# Patient Record
Sex: Male | Born: 1960 | Smoking: Current every day smoker
Health system: Southern US, Community
[De-identification: ages and names within clinical notes are randomized; demographics above are authoritative.]

## PROBLEM LIST (undated history)

## (undated) DIAGNOSIS — E785 Hyperlipidemia, unspecified: Secondary | ICD-10-CM

## (undated) HISTORY — PX: WISDOM TOOTH EXTRACTION: SHX21

## (undated) HISTORY — DX: Hyperlipidemia, unspecified: E78.5

---

## 2011-10-03 DIAGNOSIS — Z23 Encounter for immunization: Secondary | ICD-10-CM

## 2012-07-04 LAB — PSA

## 2012-09-27 ENCOUNTER — Ambulatory Visit: Payer: Self-pay | Admitting: Gastroenterology

## 2012-09-27 LAB — HM COLONOSCOPY

## 2012-09-30 LAB — PATHOLOGY REPORT

## 2012-10-18 ENCOUNTER — Ambulatory Visit (INDEPENDENT_AMBULATORY_CARE_PROVIDER_SITE_OTHER): Payer: 59

## 2012-10-18 DIAGNOSIS — Z23 Encounter for immunization: Secondary | ICD-10-CM

## 2015-01-15 ENCOUNTER — Ambulatory Visit: Payer: Self-pay | Admitting: Unknown Physician Specialty

## 2015-01-15 DIAGNOSIS — E781 Pure hyperglyceridemia: Secondary | ICD-10-CM | POA: Insufficient documentation

## 2015-01-15 DIAGNOSIS — J309 Allergic rhinitis, unspecified: Secondary | ICD-10-CM | POA: Insufficient documentation

## 2015-01-29 ENCOUNTER — Encounter: Payer: Self-pay | Admitting: Unknown Physician Specialty

## 2015-09-03 ENCOUNTER — Encounter: Payer: Self-pay | Admitting: Family Medicine

## 2015-09-10 ENCOUNTER — Ambulatory Visit (INDEPENDENT_AMBULATORY_CARE_PROVIDER_SITE_OTHER): Payer: 59 | Admitting: Family Medicine

## 2015-09-10 ENCOUNTER — Encounter: Payer: Self-pay | Admitting: Family Medicine

## 2015-09-10 VITALS — BP 129/83 | HR 78 | Temp 97.8°F | Ht 68.6 in | Wt 147.0 lb

## 2015-09-10 DIAGNOSIS — Z1159 Encounter for screening for other viral diseases: Secondary | ICD-10-CM

## 2015-09-10 DIAGNOSIS — E781 Pure hyperglyceridemia: Secondary | ICD-10-CM | POA: Diagnosis not present

## 2015-09-10 DIAGNOSIS — N529 Male erectile dysfunction, unspecified: Secondary | ICD-10-CM

## 2015-09-10 DIAGNOSIS — Z0000 Encounter for general adult medical examination without abnormal findings: Secondary | ICD-10-CM | POA: Diagnosis not present

## 2015-09-10 DIAGNOSIS — Z23 Encounter for immunization: Secondary | ICD-10-CM | POA: Diagnosis not present

## 2015-09-10 DIAGNOSIS — D485 Neoplasm of uncertain behavior of skin: Secondary | ICD-10-CM

## 2015-09-10 DIAGNOSIS — Z125 Encounter for screening for malignant neoplasm of prostate: Secondary | ICD-10-CM | POA: Diagnosis not present

## 2015-09-10 DIAGNOSIS — Z114 Encounter for screening for human immunodeficiency virus [HIV]: Secondary | ICD-10-CM

## 2015-09-10 LAB — UA/M W/RFLX CULTURE, ROUTINE
BILIRUBIN UA: NEGATIVE
GLUCOSE, UA: NEGATIVE
KETONES UA: NEGATIVE
Leukocytes, UA: NEGATIVE
Nitrite, UA: NEGATIVE
Protein, UA: NEGATIVE
RBC UA: NEGATIVE
SPEC GRAV UA: 1.02 (ref 1.005–1.030)
UUROB: 0.2 mg/dL (ref 0.2–1.0)
pH, UA: 5.5 (ref 5.0–7.5)

## 2015-09-10 LAB — LIPID PANEL PICCOLO, WAIVED
Chol/HDL Ratio Piccolo,Waive: 4 mg/dL
Cholesterol Piccolo, Waived: 228 mg/dL — ABNORMAL HIGH (ref <200)
HDL CHOL PICCOLO, WAIVED: 57 mg/dL — AB (ref >59)
LDL CHOL CALC PICCOLO WAIVED: 144 mg/dL — AB (ref <100)
Triglycerides Piccolo,Waived: 135 mg/dL (ref <150)
VLDL Chol Calc Piccolo,Waive: 27 mg/dL (ref <30)

## 2015-09-10 MED ORDER — SILDENAFIL CITRATE 100 MG PO TABS: 50.0000 mg | ORAL_TABLET | Freq: Every day | ORAL | 12 refills | Status: DC | PRN

## 2015-09-10 NOTE — Progress Notes (Signed)
BP 129/83 (BP Location: Left Arm, Patient Position: Sitting, Cuff Size: Normal)   Pulse 78   Temp 97.8 F (36.6 C)   Ht 5' 8.6" (1.742 m)   Wt 147 lb (66.7 kg)   SpO2 97%   BMI 21.96 kg/m    Subjective:    Patient ID: Steven Rivera, male    DOB: [birth year], 55 y.o.   MRN: [id number]  HPI: Steven Rivera is a 55 y.o. male presenting on 09/10/2015 for comprehensive medical examination. Current medical complaints include:none  He currently lives with: alone Interim Problems from his last visit: no  Depression Screen done today and results listed below:  Depression screen [facility] 2/9 09/10/2015  Decreased Interest 0  Down, Depressed, Hopeless 0  PHQ - 2 Score 0  Altered sleeping 2  Tired, decreased energy 1  Change in appetite 0  Feeling bad or failure about yourself  0  Trouble concentrating 0  Moving slowly or fidgety/restless 0  Suicidal thoughts 0  PHQ-9 Score 3    Past Medical History:  Past Medical History:  Diagnosis Date  . Hyperlipidemia     Surgical History:  Past Surgical History:  Procedure Laterality Date  . WISDOM TOOTH EXTRACTION      Medications:  No current outpatient prescriptions on file prior to visit.   No current facility-administered medications on file prior to visit.     Allergies:  No Known Allergies  Social History:  Social History   Social History  . Marital status: Unknown    Spouse name: N/A  . Number of children: N/A  . Years of education: N/A   Occupational History  . Not on file.   Social History Main Topics  . Smoking status: Current Every Day Smoker    Packs/day: 0.25    Types: Cigarettes  . Smokeless tobacco: Never Used  . Alcohol use 6.0 oz/week    10 Cans of beer per week  . Drug use: No  . Sexual activity: Yes   Other Topics Concern  . Not on file   Social History Narrative  . No narrative on file   History  Smoking Status  . Current Every Day Smoker  . Packs/day: 0.25  . Types: Cigarettes    Smokeless Tobacco  . Never Used   History  Alcohol Use  . 6.0 oz/week  . 10 Cans of beer per week    Family History:  Family History  Problem Relation Age of Onset  . Hypertension Mother   . Hypertension Father   . Heart disease Father   . Hypertension Brother   . Parkinson's disease Maternal Grandfather   . Cancer Paternal Grandmother     Lung  . Heart attack Paternal Grandfather     Past medical history, surgical history, medications, allergies, family history and social history reviewed with patient today and changes made to appropriate areas of the chart.   Review of Systems  Constitutional: Negative.   HENT: Negative.   Eyes: Negative.   Respiratory: Negative.   Cardiovascular: Negative.   Gastrointestinal: Positive for heartburn (due to pizza). Negative for abdominal pain, blood in stool, constipation, diarrhea, melena, nausea and vomiting.  Genitourinary: Negative.        Has some ED- more often than not, has not used viagra, would like to try it.   Musculoskeletal: Negative.   Skin: Negative.        Spot that won't heal on his L arm   Neurological: Negative.  Endo/Heme/Allergies: Positive for environmental allergies (claritin is helping). Negative for polydipsia. Does not bruise/bleed easily.  Psychiatric/Behavioral: Negative.     All other ROS negative except what is listed above and in the HPI.      Objective:    BP 129/83 (BP Location: Left Arm, Patient Position: Sitting, Cuff Size: Normal)   Pulse 78   Temp 97.8 F (36.6 C)   Ht 5' 8.6" (1.742 m)   Wt 147 lb (66.7 kg)   SpO2 97%   BMI 21.96 kg/m   Wt Readings from Last 3 Encounters:  09/10/15 147 lb (66.7 kg)  03/17/13 154 lb (69.9 kg)    Physical Exam  Constitutional: He is oriented to person, place, and time. He appears well-developed and well-nourished. No distress.  HENT:  Head: Normocephalic and atraumatic.  Right Ear: Hearing, tympanic membrane, external ear and ear canal normal.   Left Ear: Hearing, tympanic membrane, external ear and ear canal normal.  Nose: Nose normal.  Mouth/Throat: Uvula is midline, oropharynx is clear and moist and mucous membranes are normal. No oropharyngeal exudate.  Eyes: Conjunctivae, EOM and lids are normal. Pupils are equal, round, and reactive to light. Right eye exhibits no discharge. Left eye exhibits no discharge. No scleral icterus.  Neck: Normal range of motion. Neck supple. No JVD present. No tracheal deviation present. No thyromegaly present.  Cardiovascular: Normal rate, regular rhythm, normal heart sounds and intact distal pulses.  Exam reveals no gallop and no friction rub.   No murmur heard. Pulmonary/Chest: Effort normal and breath sounds normal. No stridor. No respiratory distress. He has no wheezes. He has no rales. He exhibits no tenderness.  Abdominal: Soft. Bowel sounds are normal. He exhibits no distension and no mass. There is no tenderness. There is no rebound and no guarding.  Genitourinary: Rectum normal, prostate normal and penis normal. No penile tenderness.  Musculoskeletal: Normal range of motion. He exhibits no edema, tenderness or deformity.  Lymphadenopathy:    He has no cervical adenopathy.  Neurological: He is alert and oriented to person, place, and time. He has normal reflexes. He displays normal reflexes. No cranial nerve deficit. He exhibits normal muscle tone. Coordination normal.  Skin: Skin is warm, dry and intact. No rash noted. He is not diaphoretic. No erythema. No pallor.     Psychiatric: He has a normal mood and affect. His speech is normal and behavior is normal. Judgment and thought content normal. Cognition and memory are normal.  Nursing note and vitals reviewed.   Results for orders placed or performed in visit on 09/10/15  Lipid Panel Piccolo, [employer]  Result Value Ref Range   Cholesterol Piccolo, Waived 228 (H) <200 mg/dL   HDL Chol Piccolo, Waived 57 (L) >59 mg/dL   Triglycerides  Piccolo,Waived 135 <150 mg/dL   Chol/HDL Ratio Piccolo,Waive 4.0 mg/dL   LDL Chol Calc Piccolo Waived 144 (H) <100 mg/dL   VLDL Chol Calc Piccolo,Waive 27 <30 mg/dL  UA/M w/rflx Culture, Routine  Result Value Ref Range   Specific Gravity, UA 1.020 1.005 - 1.030   pH, UA 5.5 5.0 - 7.5   Color, UA Yellow Yellow   Appearance Ur Clear Clear   Leukocytes, UA Negative Negative   Protein, UA Negative Negative/Trace   Glucose, UA Negative Negative   Ketones, UA Negative Negative   RBC, UA Negative Negative   Bilirubin, UA Negative Negative   Urobilinogen, Ur 0.2 0.2 - 1.0 mg/dL   Nitrite, UA Negative Negative  Assessment & Plan:   Problem List Items Addressed This Visit      Genitourinary   ED (erectile dysfunction)    Will start viagra. Call with any concerns.         Other   Hypertriglyceridemia    Stable. Continue to monitor. Call with any concerns.       Relevant Medications   sildenafil (VIAGRA) 100 MG tablet   Other Relevant Orders   Comprehensive metabolic panel   Lipid Panel Piccolo, Waived (Completed)    Other Visit Diagnoses    Routine general medical examination at a health care facility    -  Primary   Up to date on vaccines. Screening labs checked today. Colonoscopy up to date. Work on diet and exercise. Recheck 1 year.   Relevant Orders   CBC with Differential/Platelet   Comprehensive metabolic panel   Lipid Panel Piccolo, Waived (Completed)   PSA   TSH   UA/M w/rflx Culture, Routine (Completed)   Hepatitis C Antibody   HIV antibody   Screening for prostate cancer       Labs checked today. Await results.   Relevant Orders   PSA   Need for hepatitis C screening test       Labs checked today. Await results.   Relevant Orders   Hepatitis C Antibody   Screening for HIV without presence of risk factors       Labs checked today. Await results.   Relevant Orders   HIV antibody   Immunization due       Flu shot given today.   Relevant Orders   Flu  Vaccine QUAD 36+ mos PF IM (Fluarix & Fluzone Quad PF) (Completed)   Neoplasm of uncertain behavior of skin       Will return for shave biopsy ASAP.       Discussed aspirin prophylaxis for myocardial infarction prevention and decision was it was not indicated  LABORATORY TESTING:  Health maintenance labs ordered today as discussed above.   The natural history of prostate cancer and ongoing controversy regarding screening and potential treatment outcomes of prostate cancer has been discussed with the patient. The meaning of a false positive PSA and a false negative PSA has been discussed. He indicates understanding of the limitations of this screening test and wishes to proceed with screening PSA testing.  IMMUNIZATIONS:   - Tdap: Tetanus vaccination status reviewed: last tetanus booster within 10 years. - Influenza: Administered today - Pneumovax: Refused - Prevnar: Not applicable - Zostavax vaccine: Refused  SCREENING: - Colonoscopy: Up to date  Discussed with patient purpose of the colonoscopy is to detect colon cancer at curable precancerous or early stages   PATIENT COUNSELING:    Sexuality: Discussed sexually transmitted diseases, partner selection, use of condoms, avoidance of unintended pregnancy  and contraceptive alternatives.   Advised to avoid cigarette smoking.  I discussed with the patient that most people either abstain from alcohol or drink within safe limits (<=14/week and <=4 drinks/occasion for males, <=7/weeks and <= 3 drinks/occasion for females) and that the risk for alcohol disorders and other health effects rises proportionally with the number of drinks per week and how often a drinker exceeds daily limits.  Discussed cessation/primary prevention of drug use and availability of treatment for abuse.   Diet: Encouraged to adjust caloric intake to maintain  or achieve ideal body weight, to reduce intake of dietary saturated fat and total fat, to limit sodium  intake by avoiding high sodium foods  and not adding table salt, and to maintain adequate dietary potassium and calcium preferably from fresh fruits, vegetables, and low-fat dairy products.    stressed the importance of regular exercise  Injury prevention: Discussed safety belts, safety helmets, smoke detector, smoking near bedding or upholstery.   Dental health: Discussed importance of regular tooth brushing, flossing, and dental visits.   Follow up plan: NEXT PREVENTATIVE PHYSICAL DUE IN 1 YEAR. Return couple of weeks, for shave biopsy.

## 2015-09-10 NOTE — Assessment & Plan Note (Signed)
Stable. Continue to monitor. Call with any concerns.  ?

## 2015-09-10 NOTE — Assessment & Plan Note (Signed)
Will start viagra. Call with any concerns.  

## 2015-09-10 NOTE — Patient Instructions (Addendum)
Health Maintenance, Male A healthy lifestyle and preventative care can promote health and wellness.  Maintain regular health, dental, and eye exams.  Eat a healthy diet. Foods like vegetables, fruits, whole grains, low-fat dairy products, and lean protein foods contain the nutrients you need and are low in calories. Decrease your intake of foods high in solid fats, added sugars, and salt. Get information about a proper diet from your health care provider, if necessary.  Regular physical exercise is one of the most important things you can do for your health. Most adults should get at least 150 minutes of moderate-intensity exercise (any activity that increases your heart rate and causes you to sweat) each week. In addition, most adults need muscle-strengthening exercises on 2 or more days a week.   Maintain a healthy weight. The body mass index (BMI) is a screening tool to identify possible weight problems. It provides an estimate of body fat based on height and weight. Your health care provider can find your BMI and can help you achieve or maintain a healthy weight. For males 20 years and older:  A BMI below 18.5 is considered underweight.  A BMI of 18.5 to 24.9 is normal.  A BMI of 25 to 29.9 is considered overweight.  A BMI of 30 and above is considered obese.  Maintain normal blood lipids and cholesterol by exercising and minimizing your intake of saturated fat. Eat a balanced diet with plenty of fruits and vegetables. Blood tests for lipids and cholesterol should begin at age 20 and be repeated every 5 years. If your lipid or cholesterol levels are high, you are over age 50, or you are at high risk for heart disease, you may need your cholesterol levels checked more frequently.Ongoing high lipid and cholesterol levels should be treated with medicines if diet and exercise are not working.  If you smoke, find out from your health care provider how to quit. If you do not use tobacco, do not  start.  Lung cancer screening is recommended for adults aged 55-80 years who are at high risk for developing lung cancer because of a history of smoking. A yearly low-dose CT scan of the lungs is recommended for people who have at least a 30-pack-year history of smoking and are current smokers or have quit within the past 15 years. A pack year of smoking is smoking an average of 1 pack of cigarettes a day for 1 year (for example, a 30-pack-year history of smoking could mean smoking 1 pack a day for 30 years or 2 packs a day for 15 years). Yearly screening should continue until the smoker has stopped smoking for at least 15 years. Yearly screening should be stopped for people who develop a health problem that would prevent them from having lung cancer treatment.  If you choose to drink alcohol, do not have more than 2 drinks per day. One drink is considered to be 12 oz (360 mL) of beer, 5 oz (150 mL) of wine, or 1.5 oz (45 mL) of liquor.  Avoid the use of street drugs. Do not share needles with anyone. Ask for help if you need support or instructions about stopping the use of drugs.  High blood pressure causes heart disease and increases the risk of stroke. High blood pressure is more likely to develop in:  People who have blood pressure in the end of the normal range (100-139/85-89 mm Hg).  People who are overweight or obese.  People who are African American.    If you are 18-39 years of age, have your blood pressure checked every 3-5 years. If you are 40 years of age or older, have your blood pressure checked every year. You should have your blood pressure measured twice--once when you are at a hospital or clinic, and once when you are not at a hospital or clinic. Record the average of the two measurements. To check your blood pressure when you are not at a hospital or clinic, you can use:  An automated blood pressure machine at a pharmacy.  A home blood pressure monitor.  If you are 45-79 years  old, ask your health care provider if you should take aspirin to prevent heart disease.  Diabetes screening involves taking a blood sample to check your fasting blood sugar level. This should be done once every 3 years after age 45 if you are at a normal weight and without risk factors for diabetes. Testing should be considered at a younger age or be carried out more frequently if you are overweight and have at least 1 risk factor for diabetes.  Colorectal cancer can be detected and often prevented. Most routine colorectal cancer screening begins at the age of 50 and continues through age 75. However, your health care provider may recommend screening at an earlier age if you have risk factors for colon cancer. On a yearly basis, your health care provider may provide home test kits to check for hidden blood in the stool. A small camera at the end of a tube may be used to directly examine the colon (sigmoidoscopy or colonoscopy) to detect the earliest forms of colorectal cancer. Talk to your health care provider about this at age 50 when routine screening begins. A direct exam of the colon should be repeated every 5-10 years through age 75, unless early forms of precancerous polyps or small growths are found.  People who are at an increased risk for hepatitis B should be screened for this virus. You are considered at high risk for hepatitis B if:  You were born in a country where hepatitis B occurs often. Talk with your health care provider about which countries are considered high risk.  Your parents were born in a high-risk country and you have not received a shot to protect against hepatitis B (hepatitis B vaccine).  You have HIV or AIDS.  You use needles to inject street drugs.  You live with, or have sex with, someone who has hepatitis B.  You are a man who has sex with other men (MSM).  You get hemodialysis treatment.  You take certain medicines for conditions like cancer, organ  transplantation, and autoimmune conditions.  Hepatitis C blood testing is recommended for all people born from 1945 through 1965 and any individual with known risk factors for hepatitis C.  Healthy men should no longer receive prostate-specific antigen (PSA) blood tests as part of routine cancer screening. Talk to your health care provider about prostate cancer screening.  Testicular cancer screening is not recommended for adolescents or adult males who have no symptoms. Screening includes self-exam, a health care provider exam, and other screening tests. Consult with your health care provider about any symptoms you have or any concerns you have about testicular cancer.  Practice safe sex. Use condoms and avoid high-risk sexual practices to reduce the spread of sexually transmitted infections (STIs).  You should be screened for STIs, including gonorrhea and chlamydia if:  You are sexually active and are younger than 24 years.  You   are older than 24 years, and your health care provider tells you that you are at risk for this type of infection.  Your sexual activity has changed since you were last screened, and you are at an increased risk for chlamydia or gonorrhea. Ask your health care provider if you are at risk.  If you are at risk of being infected with HIV, it is recommended that you take a prescription medicine daily to prevent HIV infection. This is called pre-exposure prophylaxis (PrEP). You are considered at risk if:  You are a man who has sex with other men (MSM).  You are a heterosexual man who is sexually active with multiple partners.  You take drugs by injection.  You are sexually active with a partner who has HIV.  Talk with your health care provider about whether you are at high risk of being infected with HIV. If you choose to begin PrEP, you should first be tested for HIV. You should then be tested every 3 months for as long as you are taking PrEP.  Use sunscreen. Apply  sunscreen liberally and repeatedly throughout the day. You should seek shade when your shadow is shorter than you. Protect yourself by wearing long sleeves, pants, a wide-brimmed hat, and sunglasses year round whenever you are outdoors.  Tell your health care provider of new moles or changes in moles, especially if there is a change in shape or color. Also, tell your health care provider if a mole is larger than the size of a pencil eraser.  A one-time screening for abdominal aortic aneurysm (AAA) and surgical repair of large AAAs by ultrasound is recommended for men aged 68-75 years who are current or former smokers.  Stay current with your vaccines (immunizations).   This information is not intended to replace advice given to you by your health care provider. Make sure you discuss any questions you have with your health care provider.   Document Released: 06/17/2007 Document Revised: 01/09/2014 Document Reviewed: 05/16/2010 Elsevier Interactive Patient Education 2016 [employer]. Sildenafil tablets (Viagra) What is this medicine? SILDENAFIL (sil DEN a fil) is used to treat erection problems in men. This medicine may be used for other purposes; ask your health care provider or pharmacist if you have questions. What should I tell my health care provider before I take this medicine? They need to know if you have any of these conditions: -bleeding disorders -eye or vision problems, including a rare inherited eye disease called retinitis pigmentosa -anatomical deformation of the penis, Peyronie's disease, or history of priapism (painful and prolonged erection) -heart disease, angina, a history of heart attack, irregular heart beats, or other heart problems -high or low blood pressure -history of blood diseases, like sickle cell anemia or leukemia -history of stomach bleeding -kidney disease -liver disease -stroke -an unusual or allergic reaction to sildenafil, other medicines, foods, dyes,  or preservatives -pregnant or trying to get pregnant -breast-feeding How should I use this medicine? Take this medicine by mouth with a glass of water. Follow the directions on the prescription label. The dose is usually taken 1 hour before sexual activity. You should not take the dose more than once per day. Do not take your medicine more often than directed. Talk to your pediatrician regarding the use of this medicine in children. This medicine is not used in children for this condition. Overdosage: If you think you have taken too much of this medicine contact a poison control center or emergency room at once. NOTE:  This medicine is only for you. Do not share this medicine with others. What if I miss a dose? This does not apply. Do not take double or extra doses. What may interact with this medicine? Do not take this medicine with any of the following medications: -cisapride -methscopolamine nitrate -nitrates like amyl nitrite, isosorbide dinitrate, isosorbide mononitrate, nitroglycerin -nitroprusside -other medicines for erectile dysfunction like avanafil, tadalafil, vardenafil -riociguat -other sildenafil products (Revatio) This medicine may also interact with the following medications: -certain drugs for high blood pressure -certain drugs for the treatment of HIV infection or AIDS -certain drugs used for fungal or yeast infections, like fluconazole, itraconazole, ketoconazole, and voriconazole -cimetidine -erythromycin -rifampin This list may not describe all possible interactions. Give your health care provider a list of all the medicines, herbs, non-prescription drugs, or dietary supplements you use. Also tell them if you smoke, drink alcohol, or use illegal drugs. Some items may interact with your medicine. What should I watch for while using this medicine? If you notice any changes in your vision while taking this drug, call your doctor or health care professional as soon as  possible. Stop using this medicine and call your health care provider right away if you have a loss of sight in one or both eyes. Contact your doctor or health care professional right away if you have an erection that lasts longer than 4 hours or if it becomes painful. This may be a sign of a serious problem and must be treated right away to prevent permanent damage. If you experience symptoms of nausea, dizziness, chest pain or arm pain upon initiation of sexual activity after taking this medicine, you should refrain from further activity and call your doctor or health care professional as soon as possible. Do not drink alcohol to excess (examples, 5 glasses of wine or 5 shots of whiskey) when taking this medicine. When taken in excess, alcohol can increase your chances of getting a headache or getting dizzy, increasing your heart rate or lowering your blood pressure. Using this medicine does not protect you or your partner against HIV infection (the virus that causes AIDS) or other sexually transmitted diseases. What side effects may I notice from receiving this medicine? Side effects that you should report to your doctor or health care professional as soon as possible: -allergic reactions like skin rash, itching or hives, swelling of the face, lips, or tongue -breathing problems -changes in hearing -changes in vision -chest pain -fast, irregular heartbeat -prolonged or painful erection -seizures Side effects that usually do not require medical attention (report to your doctor or health care professional if they continue or are bothersome): -back pain -dizziness -flushing -headache -indigestion -muscle aches -nausea -stuffy or runny nose This list may not describe all possible side effects. Call your doctor for medical advice about side effects. You may report side effects to FDA at 1-800-FDA-1088. Where should I keep my medicine? Keep out of reach of children. Store at room temperature  between 15 and 30 degrees C (59 and 86 degrees F). Throw away any unused medicine after the expiration date. NOTE: This sheet is a summary. It may not cover all possible information. If you have questions about this medicine, talk to your doctor, pharmacist, or health care provider.    2016, Elsevier/Gold Standard. (2013-05-09 13:19:04)  You Can Quit Smoking If you are ready to quit smoking or are thinking about it, congratulations! You have chosen to help yourself be healthier and live longer! There are lots of  different ways to quit smoking. Nicotine gum, nicotine patches, a nicotine inhaler, or nicotine nasal spray can help with physical craving. Hypnosis, support groups, and medicines help break the habit of smoking. TIPS TO GET OFF AND STAY OFF CIGARETTES  Learn to predict your moods. Do not let a bad situation be your excuse to have a cigarette. Some situations in your life might tempt you to have a cigarette.  Ask friends and co-workers not to smoke around you.  Make your home smoke-free.  Never have "just one" cigarette. It leads to wanting another and another. Remind yourself of your decision to quit.  On a card, make a list of your reasons for not smoking. Read it at least the same number of times a day as you have a cigarette. Tell yourself everyday, "I do not want to smoke. I choose not to smoke."  Ask someone at home or work to help you with your plan to quit smoking.  Have something planned after you eat or have a cup of coffee. Take a walk or get other exercise to perk you up. This will help to keep you from overeating.  Try a relaxation exercise to calm you down and decrease your stress. Remember, you may be tense and nervous the first two weeks after you quit. This will pass.  Find new activities to keep your hands busy. Play with a pen, coin, or rubber band. Doodle or draw things on paper.  Brush your teeth right after eating. This will help cut down the craving for the  taste of tobacco after meals. You can try mouthwash too.  Try gum, breath mints, or diet candy to keep something in your mouth. IF YOU SMOKE AND WANT TO QUIT:  Do not stock up on cigarettes. Never buy a carton. Wait until one pack is finished before you buy another.  Never carry cigarettes with you at work or at home.  Keep cigarettes as far away from you as possible. Leave them with someone else.  Never carry matches or a lighter with you.  Ask yourself, "Do I need this cigarette or is this just a reflex?"  Bet with someone that you can quit. Put cigarette money in a piggy bank every morning. If you smoke, you give up the money. If you do not smoke, by the end of the week, you keep the money.  Keep trying. It takes 21 days to change a habit!  Talk to your doctor about using medicines to help you quit. These include nicotine replacement gum, lozenges, or skin patches.   This information is not intended to replace advice given to you by your health care provider. Make sure you discuss any questions you have with your health care provider.   Document Released: 10/15/2008 Document Revised: 03/13/2011 Document Reviewed: 10/15/2008 Elsevier Interactive Patient Education 2016 Elsevier Inc. Influenza (Flu) Vaccine (Inactivated or Recombinant):  1. Why get vaccinated? Influenza ("flu") is a contagious disease that spreads around the [place] every year, usually between October and May. Flu is caused by influenza viruses, and is spread mainly by coughing, sneezing, and close contact. Anyone can get flu. Flu strikes suddenly and can last several days. Symptoms vary by age, but can include:  fever/chills  sore throat  muscle aches  fatigue  cough  headache  runny or stuffy nose Flu can also lead to pneumonia and blood infections, and cause diarrhea and seizures in children. If you have a medical condition, such as heart or lung  disease, flu can make it worse. Flu is more  dangerous for some people. Infants and young children, people 9 years of age and older, pregnant women, and people with certain health conditions or a weakened immune system are at greatest risk. Each year thousands of people in the [place] die from flu, and many more are hospitalized. Flu vaccine can:  keep you from getting flu,  make flu less severe if you do get it, and  keep you from spreading flu to your family and other people. 2. Inactivated and recombinant flu vaccines A dose of flu vaccine is recommended every flu season. Children 6 months through 20 years of age may need two doses during the same flu season. Everyone else needs only one dose each flu season. Some inactivated flu vaccines contain a very small amount of a mercury-based preservative called thimerosal. Studies have not shown thimerosal in vaccines to be harmful, but flu vaccines that do not contain thimerosal are available. There is no live flu virus in flu shots. They cannot cause the flu. There are many flu viruses, and they are always changing. Each year a new flu vaccine is made to protect against three or four viruses that are likely to cause disease in the upcoming flu season. But even when the vaccine doesn't exactly match these viruses, it may still provide some protection. Flu vaccine cannot prevent:  flu that is caused by a virus not covered by the vaccine, or  illnesses that look like flu but are not. It takes about 2 weeks for protection to develop after vaccination, and protection lasts through the flu season. 3. Some people should not get this vaccine Tell the person who is giving you the vaccine:  If you have any severe, life-threatening allergies. If you ever had a life-threatening allergic reaction after a dose of flu vaccine, or have a severe allergy to any part of this vaccine, you may be advised not to get vaccinated. Most, but not all, types of flu vaccine contain a small amount of egg  protein.  If you ever had Guillain-Barre Syndrome (also called GBS). Some people with a history of GBS should not get this vaccine. This should be discussed with your doctor.  If you are not feeling well. It is usually okay to get flu vaccine when you have a mild illness, but you might be asked to come back when you feel better. 4. Risks of a vaccine reaction With any medicine, including vaccines, there is a chance of reactions. These are usually mild and go away on their own, but serious reactions are also possible. Most people who get a flu shot do not have any problems with it. Minor problems following a flu shot include:  soreness, redness, or swelling where the shot was given  hoarseness  sore, red or itchy eyes  cough  fever  aches  headache  itching  fatigue If these problems occur, they usually begin soon after the shot and last 1 or 2 days. More serious problems following a flu shot can include the following:  There may be a small increased risk of Guillain-Barre Syndrome (GBS) after inactivated flu vaccine. This risk has been estimated at 1 or 2 additional cases per million people vaccinated. This is much lower than the risk of severe complications from flu, which can be prevented by flu vaccine.  Young children who get the flu shot along with pneumococcal vaccine (PCV13) and/or DTaP vaccine at the same time might be slightly  more likely to have a seizure caused by fever. Ask your doctor for more information. Tell your doctor if a child who is getting flu vaccine has ever had a seizure. Problems that could happen after any injected vaccine:  People sometimes faint after a medical procedure, including vaccination. Sitting or lying down for about 15 minutes can help prevent fainting, and injuries caused by a fall. Tell your doctor if you feel dizzy, or have vision changes or ringing in the ears.  Some people get severe pain in the shoulder and have difficulty moving the  arm where a shot was given. This happens very rarely.  Any medication can cause a severe allergic reaction. Such reactions from a vaccine are very rare, estimated at about 1 in a million doses, and would happen within a few minutes to a few hours after the vaccination. As with any medicine, there is a very remote chance of a vaccine causing a serious injury or death. The safety of vaccines is always being monitored. For more information, visit: http://www.aguilar.org/ 5. What if there is a serious reaction? What should I look for?  Look for anything that concerns you, such as signs of a severe allergic reaction, very high fever, or unusual behavior. Signs of a severe allergic reaction can include hives, swelling of the face and throat, difficulty breathing, a fast heartbeat, dizziness, and weakness. These would start a few minutes to a few hours after the vaccination. What should I do?  If you think it is a severe allergic reaction or other emergency that can't wait, call 9-1-1 and get the person to the nearest hospital. Otherwise, call your doctor.  Reactions should be reported to the Vaccine Adverse Event Reporting System (VAERS). Your doctor should file this report, or you can do it yourself through the VAERS web site at www.vaers.SamedayNews.es, or by calling 314-370-3257. VAERS does not give medical advice. 6. The National Vaccine Injury Compensation Program The [employer] Vaccine Injury Compensation Program (VICP) is a federal program that was created to compensate people who may have been injured by certain vaccines. Persons who believe they may have been injured by a vaccine can learn about the program and about filing a claim by calling 828-216-5555 or visiting the [employer] website at GoldCloset.com.ee. There is a time limit to file a claim for compensation. 7. How can I learn more?  Ask your healthcare provider. He or she can give you the vaccine package insert or suggest  other sources of information.  Call your local or state health department.  Contact the Centers for Disease Control and Prevention (CDC):  Call (574)172-0189 (1-800-CDC-INFO) or  Visit CDC's website at https://gibson.com/ Vaccine Information Statement Inactivated Influenza Vaccine (08/08/2013)   This information is not intended to replace advice given to you by your health care provider. Make sure you discuss any questions you have with your health care provider.   Document Released: 10/13/2005 Document Revised: 01/09/2014 Document Reviewed: 08/11/2013 Elsevier Interactive Patient Education [employer].

## 2015-09-11 LAB — HEPATITIS C ANTIBODY: Hep C Virus Ab: <0.1 s/co ratio (ref 0.0–0.9)

## 2015-09-11 LAB — TSH: TSH: 2.64 u[IU]/mL (ref 0.450–4.500)

## 2015-09-11 LAB — CBC WITH DIFFERENTIAL/PLATELET
BASOS ABS: 0 10*3/uL (ref 0.0–0.2)
Basos: 0 %
EOS (ABSOLUTE): 0.2 10*3/uL (ref 0.0–0.4)
Eos: 4 %
HEMOGLOBIN: 14.8 g/dL (ref 12.6–17.7)
Hematocrit: 42.4 % (ref 37.5–51.0)
IMMATURE GRANS (ABS): 0 10*3/uL (ref 0.0–0.1)
Immature Granulocytes: 0 %
LYMPHS: 34 %
Lymphocytes Absolute: 1.5 10*3/uL (ref 0.7–3.1)
MCH: 32.7 pg (ref 26.6–33.0)
MCHC: 34.9 g/dL (ref 31.5–35.7)
MCV: 94 fL (ref 79–97)
MONOCYTES: 9 %
Monocytes Absolute: 0.4 10*3/uL (ref 0.1–0.9)
NEUTROS ABS: 2.3 10*3/uL (ref 1.4–7.0)
NEUTROS PCT: 53 %
Platelets: 200 10*3/uL (ref 150–379)
RBC: 4.52 x10E6/uL (ref 4.14–5.80)
RDW: 13.7 % (ref 12.3–15.4)
WBC: 4.5 10*3/uL (ref 3.4–10.8)

## 2015-09-11 LAB — PSA: Prostate Specific Ag, Serum: 2.3 ng/mL (ref 0.0–4.0)

## 2015-09-11 LAB — COMPREHENSIVE METABOLIC PANEL
ALBUMIN: 4.1 g/dL (ref 3.5–5.5)
ALT: 28 IU/L (ref 0–44)
AST: 22 IU/L (ref 0–40)
Albumin/Globulin Ratio: 1.6 (ref 1.2–2.2)
Alkaline Phosphatase: 68 IU/L (ref 39–117)
BILIRUBIN TOTAL: 0.6 mg/dL (ref 0.0–1.2)
BUN / CREAT RATIO: 15 (ref 9–20)
BUN: 14 mg/dL (ref 6–24)
CHLORIDE: 100 mmol/L (ref 96–106)
CO2: 24 mmol/L (ref 18–29)
Calcium: 9 mg/dL (ref 8.7–10.2)
Creatinine, Ser: 0.91 mg/dL (ref 0.76–1.27)
GFR calc non Af Amer: 95 mL/min/{1.73_m2} (ref >59)
GFR, EST AFRICAN AMERICAN: 109 mL/min/{1.73_m2} (ref >59)
GLOBULIN, TOTAL: 2.6 g/dL (ref 1.5–4.5)
GLUCOSE: 93 mg/dL (ref 65–99)
Potassium: 4 mmol/L (ref 3.5–5.2)
SODIUM: 139 mmol/L (ref 134–144)
TOTAL PROTEIN: 6.7 g/dL (ref 6.0–8.5)

## 2015-09-11 LAB — HIV ANTIBODY (ROUTINE TESTING W REFLEX): HIV Screen 4th Generation wRfx: NONREACTIVE

## 2015-09-13 ENCOUNTER — Encounter: Payer: Self-pay | Admitting: Family Medicine

## 2015-09-20 ENCOUNTER — Ambulatory Visit (INDEPENDENT_AMBULATORY_CARE_PROVIDER_SITE_OTHER): Payer: 59 | Admitting: Family Medicine

## 2015-09-20 ENCOUNTER — Encounter: Payer: Self-pay | Admitting: Family Medicine

## 2015-09-20 VITALS — BP 128/82 | HR 81 | Temp 97.3°F | Wt 149.0 lb

## 2015-09-20 DIAGNOSIS — D485 Neoplasm of uncertain behavior of skin: Secondary | ICD-10-CM | POA: Diagnosis not present

## 2015-09-20 NOTE — Progress Notes (Signed)
BP 128/82 (BP Location: Left Arm, Patient Position: Sitting, Cuff Size: Normal)   Pulse 81   Temp 97.3 F (36.3 C)   Wt 149 lb (67.6 kg)   SpO2 98%   BMI 22.26 kg/m    Subjective:    Patient ID: Steven Rivera, male    DOB: Oct 29, 1960, 55 y.o.   MRN: [id number]  HPI: Steven Rivera is a 55 y.o. male  Chief Complaint  Patient presents with  . shave biopsy   SKIN LESION Duration: months Location: L arm near elbow Painful: yes Itching: yes Onset: gradual Context: won't heal Associated signs and symptoms: won't heal History of skin cancer: no History of precancerous skin lesions: no Family history of skin cancer: no  Relevant past medical, surgical, family and social history reviewed and updated as indicated. Interim medical history since our last visit reviewed. Allergies and medications reviewed and updated.  Review of Systems  Constitutional: Negative.   Respiratory: Negative.   Cardiovascular: Negative.   Skin: Positive for wound. Negative for color change, pallor and rash.  Psychiatric/Behavioral: Negative.     Per HPI unless specifically indicated above     Objective:    BP 128/82 (BP Location: Left Arm, Patient Position: Sitting, Cuff Size: Normal)   Pulse 81   Temp 97.3 F (36.3 C)   Wt 149 lb (67.6 kg)   SpO2 98%   BMI 22.26 kg/m   Wt Readings from Last 3 Encounters:  09/20/15 149 lb (67.6 kg)  09/10/15 147 lb (66.7 kg)  03/17/13 154 lb (69.9 kg)    Physical Exam  Constitutional: He is oriented to person, place, and time. He appears well-developed and well-nourished. No distress.  HENT:  Head: Normocephalic and atraumatic.  Right Ear: Hearing normal.  Left Ear: Hearing normal.  Nose: Nose normal.  Eyes: Conjunctivae and lids are normal. Right eye exhibits no discharge. Left eye exhibits no discharge. No scleral icterus.  Pulmonary/Chest: Effort normal. No respiratory distress.  Musculoskeletal: Normal range of motion.    Neurological: He is alert and oriented to person, place, and time.  Skin: Skin is warm, dry and intact. No rash noted. No erythema. No pallor.  Non-healing, erythematous, scaling wound on L forearm near his elbow.  Psychiatric: He has a normal mood and affect. His speech is normal and behavior is normal. Judgment and thought content normal. Cognition and memory are normal.    Results for orders placed or performed in visit on 09/10/15  CBC with Differential/Platelet  Result Value Ref Range   WBC 4.5 3.4 - 10.8 x10E3/uL   RBC 4.52 4.14 - 5.80 x10E6/uL   Hemoglobin 14.8 12.6 - 17.7 g/dL   Hematocrit 42.4 37.5 - 51.0 %   MCV 94 79 - 97 fL   MCH 32.7 26.6 - 33.0 pg   MCHC 34.9 31.5 - 35.7 g/dL   RDW 13.7 12.3 - 15.4 %   Platelets 200 150 - 379 x10E3/uL   Neutrophils 53 %   Lymphs 34 %   Monocytes 9 %   Eos 4 %   Basos 0 %   Neutrophils Absolute 2.3 1.4 - 7.0 x10E3/uL   Lymphocytes Absolute 1.5 0.7 - 3.1 x10E3/uL   Monocytes Absolute 0.4 0.1 - 0.9 x10E3/uL   EOS (ABSOLUTE) 0.2 0.0 - 0.4 x10E3/uL   Basophils Absolute 0.0 0.0 - 0.2 x10E3/uL   Immature Granulocytes 0 %   Immature Grans (Abs) 0.0 0.0 - 0.1 x10E3/uL  Comprehensive metabolic panel  Result Value  Ref Range   Glucose 93 65 - 99 mg/dL   BUN 14 6 - 24 mg/dL   Creatinine, Ser 0.91 0.76 - 1.27 mg/dL   GFR calc non Af Amer 95 >59 mL/min/1.73   GFR calc Af Amer 109 >59 mL/min/1.73   BUN/Creatinine Ratio 15 9 - 20   Sodium 139 134 - 144 mmol/L   Potassium 4.0 3.5 - 5.2 mmol/L   Chloride 100 96 - 106 mmol/L   CO2 24 18 - 29 mmol/L   Calcium 9.0 8.7 - 10.2 mg/dL   Total Protein 6.7 6.0 - 8.5 g/dL   Albumin 4.1 3.5 - 5.5 g/dL   Globulin, Total 2.6 1.5 - 4.5 g/dL   Albumin/Globulin Ratio 1.6 1.2 - 2.2   Bilirubin Total 0.6 0.0 - 1.2 mg/dL   Alkaline Phosphatase 68 39 - 117 IU/L   AST 22 0 - 40 IU/L   ALT 28 0 - 44 IU/L  Lipid Panel Piccolo, Waived  Result Value Ref Range   Cholesterol Piccolo, Waived 228 (H) <200 mg/dL    HDL Chol Piccolo, Waived 57 (L) >59 mg/dL   Triglycerides Piccolo,Waived 135 <150 mg/dL   Chol/HDL Ratio Piccolo,Waive 4.0 mg/dL   LDL Chol Calc Piccolo Waived 144 (H) <100 mg/dL   VLDL Chol Calc Piccolo,Waive 27 <30 mg/dL  PSA  Result Value Ref Range   Prostate Specific Ag, Serum 2.3 0.0 - 4.0 ng/mL  TSH  Result Value Ref Range   TSH 2.640 0.450 - 4.500 uIU/mL  UA/M w/rflx Culture, Routine  Result Value Ref Range   Specific Gravity, UA 1.020 1.005 - 1.030   pH, UA 5.5 5.0 - 7.5   Color, UA Yellow Yellow   Appearance Ur Clear Clear   Leukocytes, UA Negative Negative   Protein, UA Negative Negative/Trace   Glucose, UA Negative Negative   Ketones, UA Negative Negative   RBC, UA Negative Negative   Bilirubin, UA Negative Negative   Urobilinogen, Ur 0.2 0.2 - 1.0 mg/dL   Nitrite, UA Negative Negative  Hepatitis C Antibody  Result Value Ref Range   Hep C Virus Ab <0.1 0.0 - 0.9 s/co ratio  HIV antibody  Result Value Ref Range   HIV Screen 4th Generation wRfx Non Reactive Non Reactive      Assessment & Plan:   Problem List Items Addressed This Visit    None    Visit Diagnoses    Neoplasm of uncertain behavior of skin    -  Primary   Concern for squamous cell as it has not been healing. Shave biopsy done today. Will await results.    Relevant Orders   Pathology Report     Skin Procedure  Procedure: Informed consent given.  Sterile prep of the area.  Area infiltrated with lidocaine with epinephrine.  Using a surgical blade, part of the upper dermis shaved off and sent  for pathology.  Area cauterized. Pt ed on scarring.     Diagnosis:   ICD-9-CM ICD-10-CM   1. Neoplasm of uncertain behavior of skin 238.2 D48.5 Pathology Report   Concern for squamous cell as it has not been healing. Shave biopsy done today. Will await results.     Lesion Location/Size: 1.5cm L forearm near elbow Physician: MJ Consent:  Risks, benefits, and alternative treatments discussed and all  questions were answered.  Patient elected to proceed and verbal consent obtained.  Description: Area prepped and draped using semi-sterile technique. Area locally anesthetized using 3.0 cc's of lidocaine 1%  with epi.  Shave biopsy of lesion performed using a #15 blade scalpel.. Area dressed with band aid and specimen sent for pathology.  Post Procedure Instructions: Wound care instructions discussed and patient was instructed to keep area clean and dry.  Signs and symptoms of infection discussed, patient agrees to contact the office ASAP should they occur.  Dressing change recommended every other day.   Follow up plan: Return in about 1 year (around 09/19/2016) for Physical.   Patient became dizzy at the check out desk. Better with sitting, a glass of water and a piece of candy. Out of work note provided.

## 2015-09-22 LAB — PATHOLOGY

## 2015-09-23 ENCOUNTER — Telehealth: Payer: Self-pay | Admitting: Family Medicine

## 2015-09-23 NOTE — Telephone Encounter (Signed)
Please let him know that his spot on his arm was just irritated skin from picking. Nothing to worry about

## 2015-09-23 NOTE — Telephone Encounter (Signed)
Patient notified

## 2016-12-22 ENCOUNTER — Encounter: Payer: Self-pay | Admitting: Family Medicine

## 2016-12-22 ENCOUNTER — Ambulatory Visit (INDEPENDENT_AMBULATORY_CARE_PROVIDER_SITE_OTHER): Payer: 59 | Admitting: Family Medicine

## 2016-12-22 VITALS — BP 123/82 | HR 85 | Temp 98.1°F | Wt 152.0 lb

## 2016-12-22 DIAGNOSIS — B9789 Other viral agents as the cause of diseases classified elsewhere: Secondary | ICD-10-CM

## 2016-12-22 DIAGNOSIS — J069 Acute upper respiratory infection, unspecified: Secondary | ICD-10-CM | POA: Diagnosis not present

## 2016-12-22 MED ORDER — AMOXICILLIN-POT CLAVULANATE 875-125 MG PO TABS: 1.0000 | ORAL_TABLET | Freq: Two times a day (BID) | ORAL | 0 refills | Status: DC

## 2016-12-22 MED ORDER — FLUTICASONE PROPIONATE 50 MCG/ACT NA SUSP: 2.0000 | Freq: Every day | NASAL | 6 refills | Status: AC

## 2016-12-22 NOTE — Progress Notes (Signed)
   BP 123/82   Pulse 85   Temp 98.1 F (36.7 C) (Oral)   Wt 152 lb (68.9 kg)   SpO2 96%   BMI 22.71 kg/m    Subjective:    Patient ID: Steven Rivera, male    DOB: 1960-06-18, 56 y.o.   MRN: [id number]  HPI: Steven Rivera is a 56 y.o. male  Chief Complaint  Patient presents with  . Sinus Problem   About 5-6 days of congestion, rhinorrhea, fatigue, cough. Denies fever, chills, body aches, CP. Has been taking nyquil with some relief. Son has been sick. Hx of seasonal allergies, not currently on allergy medications.   Relevant past medical, surgical, family and social history reviewed and updated as indicated. Interim medical history since our last visit reviewed. Allergies and medications reviewed and updated.  Review of Systems  Constitutional: Positive for fatigue.  HENT: Positive for congestion, rhinorrhea and sinus pressure.   Respiratory: Positive for cough.   Cardiovascular: Negative.   Musculoskeletal: Negative.   Neurological: Negative.   Psychiatric/Behavioral: Negative.    Per HPI unless specifically indicated above     Objective:    BP 123/82   Pulse 85   Temp 98.1 F (36.7 C) (Oral)   Wt 152 lb (68.9 kg)   SpO2 96%   BMI 22.71 kg/m   Wt Readings from Last 3 Encounters:  12/22/16 152 lb (68.9 kg)  09/20/15 149 lb (67.6 kg)  09/10/15 147 lb (66.7 kg)    Physical Exam  Constitutional: He is oriented to person, place, and time. He appears well-developed and well-nourished.  HENT:  Head: Atraumatic.  Right Ear: External ear normal.  Left Ear: External ear normal.  Oropharynx injected Nasal mucosa boggy with rhinorrhea present  Eyes: Conjunctivae are normal. No scleral icterus.  Neck: Normal range of motion. Neck supple.  Cardiovascular: Normal rate and normal heart sounds.  Pulmonary/Chest: Effort normal and breath sounds normal. No respiratory distress. He has no wheezes.  Musculoskeletal: Normal range of motion.  Lymphadenopathy:    He  has no cervical adenopathy.  Neurological: He is alert and oriented to person, place, and time.  Skin: Skin is warm and dry.  Psychiatric: He has a normal mood and affect. His behavior is normal.  Nursing note and vitals reviewed.     Assessment & Plan:   Problem List Items Addressed This Visit    None    Visit Diagnoses    Viral URI with cough    -  Primary   Start flonase and claritin daily in case allergy component. OTC remedies discussed, augmentin sent in case worsening over weekend. F/u if no better       Follow up plan: Return if symptoms worsen or fail to improve.

## 2016-12-24 NOTE — Patient Instructions (Signed)
Follow up as needed

## 2017-05-17 ENCOUNTER — Ambulatory Visit (INDEPENDENT_AMBULATORY_CARE_PROVIDER_SITE_OTHER): Payer: 59 | Admitting: Family Medicine

## 2017-05-17 ENCOUNTER — Inpatient Hospital Stay: Admission: RE | Admit: 2017-05-17 | Payer: Self-pay | Source: Ambulatory Visit

## 2017-05-17 ENCOUNTER — Ambulatory Visit
Admission: RE | Admit: 2017-05-17 | Discharge: 2017-05-17 | Disposition: A | Discharge status: Home / Self Care | Payer: 59 | Source: Ambulatory Visit | Attending: Family Medicine | Admitting: Family Medicine

## 2017-05-17 ENCOUNTER — Encounter: Payer: Self-pay | Admitting: Family Medicine

## 2017-05-17 ENCOUNTER — Telehealth: Payer: Self-pay | Admitting: Family Medicine

## 2017-05-17 VITALS — BP 129/85 | HR 86 | Temp 98.3°F | Wt 148.1 lb

## 2017-05-17 DIAGNOSIS — R251 Tremor, unspecified: Secondary | ICD-10-CM | POA: Diagnosis not present

## 2017-05-17 DIAGNOSIS — R29818 Other symptoms and signs involving the nervous system: Secondary | ICD-10-CM | POA: Diagnosis present

## 2017-05-17 LAB — UA/M W/RFLX CULTURE, ROUTINE
BILIRUBIN UA: NEGATIVE
Glucose, UA: NEGATIVE
KETONES UA: NEGATIVE
Leukocytes, UA: NEGATIVE
Nitrite, UA: NEGATIVE
Protein, UA: NEGATIVE
RBC UA: NEGATIVE
Specific Gravity, UA: 1.02 (ref 1.005–1.030)
UUROB: 0.2 mg/dL (ref 0.2–1.0)
pH, UA: 6.5 (ref 5.0–7.5)

## 2017-05-17 LAB — BAYER DCA HB A1C WAIVED: HB A1C (BAYER DCA - WAIVED): 5.6 % (ref <7.0)

## 2017-05-17 NOTE — Telephone Encounter (Signed)
Copied from [place] [id number]. Topic: Quick Communication - See Telephone Encounter >> May 17, 2017 12:59 PM Vernona Rieger wrote: CRM for notification. See Telephone encounter for: 05/17/17.  Kindra @ [facility] dept needs a prior autho for the patient to have a STAT CT on 5/17. Call back @ [phone]

## 2017-05-17 NOTE — Telephone Encounter (Signed)
CT has been authorized. Number in referral, as well as left on CT's vm

## 2017-05-17 NOTE — Telephone Encounter (Signed)
Patient notified

## 2017-05-17 NOTE — Telephone Encounter (Signed)
Please let him know that his CT came back normal- we'll wait on the blood work and see if anything comes up. Thanks!

## 2017-05-17 NOTE — Patient Instructions (Signed)
Tremor A tremor is trembling or shaking that you cannot control. Most tremors affect the hands or arms. Tremors can also affect the head, vocal cords, face, and other parts of the body. There are many types of tremors. Common types include:  Essential tremor. These usually occur in people over the age of 40. It may run in families and can happen in otherwise healthy people.  Resting tremor. These occur when the muscles are at rest, such as when your hands are resting in your lap. People with Parkinson disease often have resting tremors.  Postural tremor. These occur when you try to hold a pose, such as keeping your hands outstretched.  Kinetic tremor. These occur during purposeful movement, such as trying to touch a finger to your nose.  Task-specific tremor. These may occur when you perform tasks such as handwriting, speaking, or standing.  Psychogenic tremor. These dramatically lessen or disappear when you are distracted. They can happen in people of all ages.  Some types of tremors have no known cause. Tremors can also be a symptom of nervous system problems (neurological disorders) that may occur with aging. Some tremors go away with treatment while others do not. Follow these instructions at home: Watch your tremor for any changes. The following actions may help to lessen any discomfort you are feeling:  Take medicines only as directed by your health care provider.  Limit alcohol intake to no more than 1 drink per day for nonpregnant women and 2 drinks per day for men. One drink equals 12 oz of beer, 5 oz of wine, or 1 oz of hard liquor.  Do not use any tobacco products, including cigarettes, chewing tobacco, or electronic cigarettes. If you need help quitting, ask your health care provider.  Avoid extreme heat or cold.  Limit the amount of caffeine you consumeas directed by your health care provider.  Try to get 8 hours of sleep each night.  Find ways to manage your stress,  such as meditation or yoga.  Keep all follow-up visits as directed by your health care provider. This is important.  Contact a health care provider if:  You start having a tremor after starting a new medicine.  You have tremor with other symptoms such as: ? Numbness. ? Tingling. ? Pain. ? Weakness.  Your tremor gets worse.  Your tremor interferes with your day-to-day life. This information is not intended to replace advice given to you by your health care provider. Make sure you discuss any questions you have with your health care provider. Document Released: 12/09/2001 Document Revised: 08/22/2015 Document Reviewed: 06/16/2013 Elsevier Interactive Patient Education  2018 [employer]  

## 2017-05-17 NOTE — Progress Notes (Signed)
BP 129/85 (BP Location: Right Arm, Patient Position: Sitting, Cuff Size: Normal)   Pulse 86   Temp 98.3 F (36.8 C)   Wt 148 lb 2 oz (67.2 kg)   SpO2 96%   BMI 22.13 kg/m    Subjective:    Patient ID: Steven Rivera, male    DOB: 25-Feb-1960, 57 y.o.   MRN: [id number]  HPI: Steven Rivera is a 57 y.o. male  Chief Complaint  Patient presents with  . Eye Twitching    jaw twitching, patient states that this past Tuesday his eye and jaw started twitching really bad, he finished prednisone 8 days before that. When he woke up on Wednesday he was fine and has not had an issue since.   Was on prednisone for a cold about 2 weeks ago. Was taking 50mg  of the prednisone up until 11 days ago. 2 days ago, started having twitching and felt like his hands were shaking, but they weren't. This started 2 days ago about 4PM and lasted about an hour, then went away for about a 1/2 hour and then came back for several hours. Wasn't feeling nervous. Eye and jaw were twitching. Some changes in his vision- made it worse. He had no pains in his chest. Was having some weakness throughout, feeling tired. He went to bed, woke up and all of this is 100% gone- so he called to come in.   Relevant past medical, surgical, family and social history reviewed and updated as indicated. Interim medical history since our last visit reviewed. Allergies and medications reviewed and updated.  Review of Systems  Constitutional: Negative.   Respiratory: Negative.   Cardiovascular: Negative.   Neurological: Positive for tremors, weakness and light-headedness. Negative for dizziness, seizures, syncope, facial asymmetry, speech difficulty, numbness and headaches.  Psychiatric/Behavioral: Negative.     Per HPI unless specifically indicated above     Objective:    BP 129/85 (BP Location: Right Arm, Patient Position: Sitting, Cuff Size: Normal)   Pulse 86   Temp 98.3 F (36.8 C)   Wt 148 lb 2 oz (67.2 kg)   SpO2  96%   BMI 22.13 kg/m   Wt Readings from Last 3 Encounters:  05/17/17 148 lb 2 oz (67.2 kg)  12/22/16 152 lb (68.9 kg)  09/20/15 149 lb (67.6 kg)    Physical Exam  Constitutional: He is oriented to person, place, and time. He appears well-developed and well-nourished. No distress.  HENT:  Head: Normocephalic and atraumatic.  Right Ear: Hearing normal.  Left Ear: Hearing normal.  Nose: Nose normal.  Eyes: Conjunctivae and lids are normal. Right eye exhibits no discharge. Left eye exhibits no discharge. No scleral icterus.  Neck: Normal range of motion. Neck supple. No JVD present. No tracheal deviation present. No thyromegaly present.  No bruits  Cardiovascular: Normal rate, regular rhythm, normal heart sounds and intact distal pulses. Exam reveals no gallop and no friction rub.  No murmur heard. Pulmonary/Chest: Effort normal and breath sounds normal. No stridor. No respiratory distress. He has no wheezes. He has no rales. He exhibits no tenderness.  Musculoskeletal: Normal range of motion. He exhibits no edema, tenderness or deformity.  Lymphadenopathy:    He has no cervical adenopathy.  Neurological: He is alert and oriented to person, place, and time. He displays normal reflexes. No cranial nerve deficit or sensory deficit. He exhibits normal muscle tone. Coordination normal.  Skin: Skin is warm, dry and intact. Capillary refill takes less than 2  seconds. No rash noted. He is not diaphoretic. No erythema. No pallor.  Psychiatric: He has a normal mood and affect. His speech is normal and behavior is normal. Judgment and thought content normal. Cognition and memory are normal.  Nursing note and vitals reviewed.   Results for orders placed or performed in visit on 09/20/15  Pathology Report  Result Value Ref Range   . Comment    . Comment    . Comment    . Comment    . Comment    . Comment    . Comment       Assessment & Plan:   Problem List Items Addressed This Visit     None    Visit Diagnoses    Tremor    -  Primary   Started with severe tremor- 9 days after 5 day burst of prednisone. Concern for TIA- will check labs and CT head. Await results.    Relevant Orders   CBC with Differential/Platelet   Bayer DCA Hb A1c Waived   Comprehensive metabolic panel   Lipid Panel w/o Chol/HDL Ratio   Thyroid Panel With TSH   UA/M w/rflx Culture, Routine   Other symptoms and signs involving the nervous system       Started with severe tremor- 9 days after 5 day burst of prednisone. Concern for TIA- will check labs and CT head. Await results.    Relevant Orders   CT Head Wo Contrast       Follow up plan: Return in about 2 weeks (around 05/31/2017) for physical- follow up on shakes.

## 2017-05-18 ENCOUNTER — Ambulatory Visit: Admission: RE | Admit: 2017-05-18 | Payer: Self-pay | Source: Ambulatory Visit

## 2017-05-18 ENCOUNTER — Telehealth: Payer: Self-pay | Admitting: Family Medicine

## 2017-05-18 DIAGNOSIS — R251 Tremor, unspecified: Secondary | ICD-10-CM

## 2017-05-18 LAB — CBC WITH DIFFERENTIAL/PLATELET
BASOS ABS: 0 10*3/uL (ref 0.0–0.2)
Basos: 0 %
EOS (ABSOLUTE): 0.1 10*3/uL (ref 0.0–0.4)
EOS: 1 %
HEMATOCRIT: 46.6 % (ref 37.5–51.0)
HEMOGLOBIN: 15.3 g/dL (ref 13.0–17.7)
IMMATURE GRANS (ABS): 0 10*3/uL (ref 0.0–0.1)
Immature Granulocytes: 0 %
LYMPHS ABS: 1.9 10*3/uL (ref 0.7–3.1)
LYMPHS: 32 %
MCH: 31.9 pg (ref 26.6–33.0)
MCHC: 32.8 g/dL (ref 31.5–35.7)
MCV: 97 fL (ref 79–97)
MONOCYTES: 6 %
Monocytes Absolute: 0.4 10*3/uL (ref 0.1–0.9)
Neutrophils Absolute: 3.5 10*3/uL (ref 1.4–7.0)
Neutrophils: 61 %
Platelets: 230 10*3/uL (ref 150–379)
RBC: 4.79 x10E6/uL (ref 4.14–5.80)
RDW: 14 % (ref 12.3–15.4)
WBC: 5.9 10*3/uL (ref 3.4–10.8)

## 2017-05-18 LAB — COMPREHENSIVE METABOLIC PANEL
ALBUMIN: 4.3 g/dL (ref 3.5–5.5)
ALK PHOS: 72 IU/L (ref 39–117)
ALT: 19 IU/L (ref 0–44)
AST: 19 IU/L (ref 0–40)
Albumin/Globulin Ratio: 1.5 (ref 1.2–2.2)
BUN / CREAT RATIO: 14 (ref 9–20)
BUN: 15 mg/dL (ref 6–24)
Bilirubin Total: 0.6 mg/dL (ref 0.0–1.2)
CHLORIDE: 100 mmol/L (ref 96–106)
CO2: 24 mmol/L (ref 20–29)
Calcium: 9.4 mg/dL (ref 8.7–10.2)
Creatinine, Ser: 1.04 mg/dL (ref 0.76–1.27)
GFR calc non Af Amer: 79 mL/min/{1.73_m2} (ref >59)
GFR, EST AFRICAN AMERICAN: 92 mL/min/{1.73_m2} (ref >59)
GLOBULIN, TOTAL: 2.9 g/dL (ref 1.5–4.5)
GLUCOSE: 85 mg/dL (ref 65–99)
Potassium: 4.9 mmol/L (ref 3.5–5.2)
SODIUM: 138 mmol/L (ref 134–144)
Total Protein: 7.2 g/dL (ref 6.0–8.5)

## 2017-05-18 LAB — LIPID PANEL W/O CHOL/HDL RATIO
Cholesterol, Total: 223 mg/dL — ABNORMAL HIGH (ref 100–199)
HDL: 44 mg/dL (ref >39)
LDL Calculated: 152 mg/dL — ABNORMAL HIGH (ref 0–99)
Triglycerides: 134 mg/dL (ref 0–149)
VLDL CHOLESTEROL CAL: 27 mg/dL (ref 5–40)

## 2017-05-18 LAB — THYROID PANEL WITH TSH
Free Thyroxine Index: 2.2 (ref 1.2–4.9)
T3 UPTAKE RATIO: 29 % (ref 24–39)
T4, Total: 7.5 ug/dL (ref 4.5–12.0)
TSH: 2.05 u[IU]/mL (ref 0.450–4.500)

## 2017-05-18 NOTE — Telephone Encounter (Signed)
Please let him know that all his labs are nice and normal. We'll get him into see the neurologist to further work up the shakes/twitching, but everything else is normal.

## 2017-05-18 NOTE — Telephone Encounter (Signed)
Patient notified

## 2017-05-21 ENCOUNTER — Encounter: Payer: Self-pay | Admitting: Neurology

## 2017-05-23 NOTE — Progress Notes (Signed)
Steven Rivera was seen today in neurologic consultation at the request of Valerie Roys, DO.  The consultation is for the evaluation of tremor.  The records that were made available to me were reviewed.  Pt really doesn't describe tremor, although he calls it that.  He states that a week ago Tuesday, he reported "tremor of the cheeks and eyelids."  He didn't look at all in the mirror but coworkers thought that face was "drawing" on "one side (not sure what side)."  He seemed to be blinking alot.  He tried to eat late in the night and felt that he couldn't swallow.  He tried to drink and "it would run out the side of the mouth."  He had no trouble with paresthesias.  Speech seemed slurred.  No trouble ambulating.  No new meds.  No EtOH.  No drugs.  He went to bed and he never had it again.     Neuroimaging has  previously been performed.  It is available for my review today.  Patient had a CT of the brain on May 17, 2017 and that was normal per report.  I did note some BG calcifications on that exam.  PREVIOUS MEDICATIONS:   ALLERGIES:  No Known Allergies  CURRENT MEDICATIONS:  Outpatient Encounter Medications as of 05/25/2017  Medication Sig  . fluticasone (FLONASE) 50 MCG/ACT nasal spray Place 2 sprays into both nostrils daily.  Marland Kitchen loratadine (CLARITIN) 10 MG tablet Take 10 mg by mouth daily.   No facility-administered encounter medications on file as of 05/25/2017.     PAST MEDICAL HISTORY:   Past Medical History:  Diagnosis Date  . Hyperlipidemia     PAST SURGICAL HISTORY:   Past Surgical History:  Procedure Laterality Date  . WISDOM TOOTH EXTRACTION      SOCIAL HISTORY:   Social History   Socioeconomic History  . Marital status: Unknown    Spouse name: Not on file  . Number of children: Not on file  . Years of education: Not on file  . Highest education level: Not on file  Occupational History    Comment: machinest  Social Needs  . Financial resource strain: Not  on file  . Food insecurity:    Worry: Not on file    Inability: Not on file  . Transportation needs:    Medical: Not on file    Non-medical: Not on file  Tobacco Use  . Smoking status: Current Every Day Smoker    Packs/day: 0.75    Years: 40.00    Pack years: 30.00    Types: Cigarettes  . Smokeless tobacco: Never Used  Substance and Sexual Activity  . Alcohol use: Yes    Alcohol/week: 7.2 oz    Types: 12 Cans of beer per week    Comment: 12 beers/week  . Drug use: No  . Sexual activity: Yes  Lifestyle  . Physical activity:    Days per week: Not on file    Minutes per session: Not on file  . Stress: Not on file  Relationships  . Social connections:    Talks on phone: Not on file    Gets together: Not on file    Attends religious service: Not on file    Active member of club or organization: Not on file    Attends meetings of clubs or organizations: Not on file    Relationship status: Not on file  . Intimate partner violence:    Fear of  current or ex partner: Not on file    Emotionally abused: Not on file    Physically abused: Not on file    Forced sexual activity: Not on file  Other Topics Concern  . Not on file  Social History Narrative  . Not on file    FAMILY HISTORY:   Family Status  Relation Name Status  . Mother  Alive  . Father  Deceased  . Brother X 3 Alive  . Sister  Alive  . Son  Alive  . MGM  Deceased  . MGF  Deceased  . PGM  Deceased  . PGF  Deceased  . Brother X1 Deceased       MVA  . Son  Alive    ROS:  A complete 10 system review of systems was obtained and was unremarkable apart from what is mentioned above.  PHYSICAL EXAMINATION:    VITALS:   Vitals:   05/25/17 0942  BP: 110/80  Pulse: 90  SpO2: 97%  Weight: 149 lb (67.6 kg)  Height: 5\' 9"  (1.753 m)    GEN:  Normal appears male in no acute distress.  Appears stated age. HEENT:  Normocephalic, atraumatic. The mucous membranes are moist. The superficial temporal arteries are  without ropiness or tenderness. Cardiovascular: Regular rate and rhythm. Lungs: Clear to auscultation bilaterally. Neck/Heme: There are no carotid bruits noted bilaterally.  NEUROLOGICAL: Orientation:  The patient is alert and oriented x 3.  Fund of knowledge is appropriate.  Recent and remote memory intact.  Attention span and concentration normal.  Repeats and names without difficulty. Cranial nerves: There is good facial symmetry. The pupils are equal round and reactive to light bilaterally. Fundoscopic exam reveals clear disc margins bilaterally. Extraocular muscles are intact and visual fields are full to confrontational testing. Speech is fluent and clear. Soft palate rises symmetrically and there is no tongue deviation. Hearing is intact to conversational tone. Tone: Tone is good throughout. Sensation: Sensation is intact to light touch and pinprick throughout (facial, trunk, extremities). Vibration is intact at the bilateral big toe. There is no extinction with double simultaneous stimulation. There is no sensory dermatomal level identified. Coordination:  The patient has no difficulty with RAM's or FNF bilaterally. Motor: Strength is 5/5 in the bilateral upper and lower extremities.  Shoulder shrug is equal and symmetric. There is no pronator drift.  There are no fasciculations noted. DTR's: Deep tendon reflexes are 2/4 at the bilateral biceps, triceps, brachioradialis, patella and achilles.  Plantar responses are downgoing bilaterally. Gait and Station: The patient is able to ambulate without difficulty. The patient is able to heel toe walk without any difficulty. The patient is able to ambulate in a tandem fashion. The patient is able to stand in the Romberg position. Abnormal movements:  none  Labs:   Chemistry      Component Value Date/Time   NA 138 05/17/2017 1343   K 4.9 05/17/2017 1343   CL 100 05/17/2017 1343   CO2 24 05/17/2017 1343   BUN 15 05/17/2017 1343   CREATININE  1.04 05/17/2017 1343      Component Value Date/Time   CALCIUM 9.4 05/17/2017 1343   ALKPHOS 72 05/17/2017 1343   AST 19 05/17/2017 1343   ALT 19 05/17/2017 1343   BILITOT 0.6 05/17/2017 1343     Lab Results  Component Value Date   TSH 2.050 05/17/2017   Lab Results  Component Value Date   CHOL 223 (H) 05/17/2017   HDL 44  05/17/2017   LDLCALC 152 (H) 05/17/2017   TRIG 134 05/17/2017      IMPRESSION/PLAN  1. Possible TIA  -difficult to ascertain through history.  TIA certainly would not cause facial spasm bilaterally or blinking, but he also did not look in the mirror when it happened.  He describes acute onset of swallowing difficulties, speech difficulties, drooling when trying to eat and unilateral facial asymmetry as described by his coworkers.  Suspect likely TIA.  We discussed the diagnosis as well as pathophysiology of the disease.  We discussed signs and sx's of stroke and importance of calling 911 immediately should he have any of these.  Patient education was provided.    -Talked about stroke risk factors which include tobacco use, hyperlipidemia.  Talked about those risk factors which are modifiable.    -Talked about importance of lipid control and proper diet.  Lipids should be managed intensively, with a goal LDL < 70 mg/dL.  His LDL was quite high when last checked last week, but he states that he had just eaten at [employer] and was not fasting.  He is fasting today.  I will recheck this.  -Talked about importance of blood pressure control with a goal <130/80 mm Hg.   -I counseled the patient on measures to reduce stroke risk, including the importance of medication compliance, risk factor control, exercise, healthy diet, and avoidance of smoking.    -We will add aspirin, 81 mg daily.  Risks, benefits, side effects and alternative therapies were discussed.  The opportunity to ask questions was given and they were answered to the best of my ability.  The patient  expressed understanding and willingness to follow the outlined treatment protocols.  -will do MRI brain, although told him it may be normal  -will do carotid u/s  2.  Tobacco abuse  -Currently smoking just under 1 packs/day    - Patient was informed of the dangers of tobacco abuse including stroke, cancer, and MI, as well as benefits of tobacco cessation.  - Patient "wants to quit but it's hard."    - Approximately 3 mins were spent counseling patient cessation techniques. We discussed various methods to help quit smoking, including deciding on a date to quit, joining a support group, pharmacological agents- nicotine gum/patch/lozenges, chantix,   - I will reassess his progress at future visit.  3.  Will call him with results of the above.  He will see me in 6 months, sooner should new issues arise.  Neuro exam non focal and non lateralizing today.        Cc:  Park Liter P, DO

## 2017-05-25 ENCOUNTER — Ambulatory Visit (INDEPENDENT_AMBULATORY_CARE_PROVIDER_SITE_OTHER): Payer: 59 | Admitting: Neurology

## 2017-05-25 ENCOUNTER — Encounter: Payer: Self-pay | Admitting: Neurology

## 2017-05-25 ENCOUNTER — Other Ambulatory Visit: Payer: 59

## 2017-05-25 VITALS — BP 110/80 | HR 90 | Ht 69.0 in | Wt 149.0 lb

## 2017-05-25 DIAGNOSIS — E7849 Other hyperlipidemia: Secondary | ICD-10-CM

## 2017-05-25 DIAGNOSIS — G459 Transient cerebral ischemic attack, unspecified: Secondary | ICD-10-CM

## 2017-05-25 DIAGNOSIS — Z720 Tobacco use: Secondary | ICD-10-CM

## 2017-05-25 LAB — LIPID PANEL
CHOLESTEROL: 207 mg/dL — AB (ref <200)
HDL: 43 mg/dL (ref >40)
LDL CHOLESTEROL (CALC): 139 mg/dL — AB
Non-HDL Cholesterol (Calc): 164 mg/dL (calc) — ABNORMAL HIGH (ref <130)
Total CHOL/HDL Ratio: 4.8 (calc) (ref <5.0)
Triglycerides: 127 mg/dL (ref <150)

## 2017-05-25 NOTE — Patient Instructions (Addendum)
1.  Start aspirin - 81 mg daily 2.  Stop smoking.  We have programs to help you if you are interested.   3. Your provider has requested that you have labwork completed today. Please go to [facility] ([id number]) on the second floor of this building before leaving the office today. You do not need to check in. If you are not called within 15 minutes please check with the front desk.  4. We have scheduled you at [facility] for your carotid ultrasound. They are located at [address] [place], [place] [address]. You are scheduled for 06/07/17 at 3:00 pm. If this is not a good date/time please call [phone] to reschedule.  5. We have scheduled you at [facility] outpatient imaging for your MRI on 06/06/17 at 11:00 am. Please arrive 15 minutes prior and go to radiology at [facility] in [place]. If you need to reschedule for any reason please call [phone].

## 2017-05-29 ENCOUNTER — Other Ambulatory Visit: Payer: Self-pay | Admitting: Neurology

## 2017-05-29 ENCOUNTER — Telehealth: Payer: Self-pay | Admitting: Neurology

## 2017-05-29 DIAGNOSIS — G459 Transient cerebral ischemic attack, unspecified: Secondary | ICD-10-CM

## 2017-05-29 MED ORDER — ATORVASTATIN CALCIUM 20 MG PO TABS: 20.0000 mg | ORAL_TABLET | Freq: Every day | ORAL | 1 refills | Status: AC

## 2017-05-29 NOTE — Telephone Encounter (Signed)
Patient made aware and he is agreeable. RX sent to pharmacy.

## 2017-05-29 NOTE — Telephone Encounter (Signed)
-----   Message from [place], DO sent at 05/29/2017  7:24 AM EDT ----- Let pt know that fasting LDL was still quite high.  Would recommend he start lipitor, 20 mg daily and follow low chol diet

## 2017-06-06 ENCOUNTER — Ambulatory Visit
Admission: RE | Admit: 2017-06-06 | Discharge: 2017-06-06 | Disposition: A | Discharge status: Home / Self Care | Payer: 59 | Source: Ambulatory Visit | Attending: Neurology | Admitting: Neurology

## 2017-06-06 ENCOUNTER — Other Ambulatory Visit: Payer: Self-pay | Admitting: Neurology

## 2017-06-06 DIAGNOSIS — Z0100 Encounter for examination of eyes and vision without abnormal findings: Secondary | ICD-10-CM | POA: Diagnosis not present

## 2017-06-06 DIAGNOSIS — G459 Transient cerebral ischemic attack, unspecified: Secondary | ICD-10-CM

## 2017-06-06 DIAGNOSIS — Z139 Encounter for screening, unspecified: Secondary | ICD-10-CM

## 2017-06-07 ENCOUNTER — Ambulatory Visit (INDEPENDENT_AMBULATORY_CARE_PROVIDER_SITE_OTHER): Payer: 59

## 2017-06-07 ENCOUNTER — Telehealth: Payer: Self-pay | Admitting: Neurology

## 2017-06-07 DIAGNOSIS — G459 Transient cerebral ischemic attack, unspecified: Secondary | ICD-10-CM | POA: Diagnosis not present

## 2017-06-07 NOTE — Telephone Encounter (Signed)
Tried to call to let patient know results. No answer. No voicemail set up.

## 2017-06-07 NOTE — Telephone Encounter (Signed)
-----   Message from [place], DO sent at 06/07/2017  7:25 AM EDT ----- Reviewed.  Let pt know that MRI brain is normal

## 2017-06-08 ENCOUNTER — Telehealth: Payer: Self-pay | Admitting: Neurology

## 2017-06-08 NOTE — Telephone Encounter (Signed)
Patient made aware Carotid US and MR okay.

## 2017-06-08 NOTE — Telephone Encounter (Signed)
-----   Message from [place], DO sent at 06/08/2017  7:36 AM EDT ----- Let pt know that carotid u/s looks good

## 2018-03-23 ENCOUNTER — Encounter: Payer: Self-pay | Admitting: Emergency Medicine

## 2018-03-23 ENCOUNTER — Emergency Department
Admission: EM | Admit: 2018-03-23 | Discharge: 2018-03-23 | Disposition: A | Discharge status: Home / Self Care | Payer: 59 | Attending: Emergency Medicine | Admitting: Emergency Medicine

## 2018-03-23 ENCOUNTER — Other Ambulatory Visit: Payer: Self-pay

## 2018-03-23 DIAGNOSIS — Z79899 Other long term (current) drug therapy: Secondary | ICD-10-CM | POA: Insufficient documentation

## 2018-03-23 DIAGNOSIS — F17210 Nicotine dependence, cigarettes, uncomplicated: Secondary | ICD-10-CM | POA: Insufficient documentation

## 2018-03-23 DIAGNOSIS — L0291 Cutaneous abscess, unspecified: Secondary | ICD-10-CM

## 2018-03-23 MED ORDER — SULFAMETHOXAZOLE-TRIMETHOPRIM 800-160 MG PO TABS: 1.0000 | ORAL_TABLET | Freq: Two times a day (BID) | ORAL | 0 refills | Status: DC

## 2018-03-23 NOTE — ED Notes (Signed)
First Nurse Note: Pt c/o rash x 1 month, pt states rash is now "tunneling".

## 2018-03-23 NOTE — ED Provider Notes (Signed)
[facility] Emergency Department Provider Note  ____________________________________________   First MD Initiated Contact with Patient 03/23/18 1128     (approximate)  I have reviewed the triage vital signs and the nursing notes.   HISTORY  Chief Complaint Rash    HPI Steven Rivera is a 58 y.o. male presents emergency department with a rash on the back of his leg and in his groin area.  He states his symptoms have been there for for 1 months but recently have worsened.  He states they developed heads on them and pus started to drain.  He denies any fever or chills.  Denies any chest pain or shortness of breath.    Past Medical History:  Diagnosis Date   Hyperlipidemia     Patient Active Problem List   Diagnosis Date Noted   ED (erectile dysfunction) 09/10/2015   Allergic rhinitis 01/15/2015   Hypertriglyceridemia 01/15/2015    Past Surgical History:  Procedure Laterality Date   WISDOM TOOTH EXTRACTION      Prior to Admission medications   Medication Sig Start Date End Date Taking? Authorizing Provider  atorvastatin (LIPITOR) 20 MG tablet Take 1 tablet (20 mg total) by mouth daily. 05/29/17   Tat, Eustace Quail, DO  fluticasone (FLONASE) 50 MCG/ACT nasal spray Place 2 sprays into both nostrils daily. 12/22/16   Volney American, PA-C  loratadine (CLARITIN) 10 MG tablet Take 10 mg by mouth daily.    [provider]  sulfamethoxazole-trimethoprim (BACTRIM DS,SEPTRA DS) 800-160 MG tablet Take 1 tablet by mouth 2 (two) times daily. 03/23/18   Versie Starks, PA-C    Allergies Patient has no known allergies.  Family History  Problem Relation Age of Onset   Hypertension Mother    Hypertension Father    Heart disease Father    Hypertension Brother    Parkinson's disease Maternal Grandfather    Cancer Paternal Grandmother        Lung   Heart attack Paternal Grandfather     Social History Social History    Tobacco Use   Smoking status: Current Every Day Smoker    Packs/day: 0.75    Years: 40.00    Pack years: 30.00    Types: Cigarettes   Smokeless tobacco: Never Used  Substance Use Topics   Alcohol use: Yes    Alcohol/week: 12.0 standard drinks    Types: 12 Cans of beer per week    Comment: 12 beers/week   Drug use: No    Review of Systems  Constitutional: No fever/chills Eyes: No visual changes. ENT: No sore throat. Respiratory: Denies cough Genitourinary: Negative for dysuria. Musculoskeletal: Negative for back pain. Skin: Positive for rash.    ____________________________________________   PHYSICAL EXAM:  VITAL SIGNS: ED Triage Vitals  Enc Vitals Group     BP 03/23/18 1118 (!) 141/87     Pulse Rate 03/23/18 1118 84     Resp 03/23/18 1118 16     Temp 03/23/18 1118 98.2 F (36.8 C)     Temp Source 03/23/18 1118 Oral     SpO2 03/23/18 1118 96 %     Weight 03/23/18 1117 155 lb (70.3 kg)     Height 03/23/18 1117 5\' 10"  (1.778 m)     Head Circumference --      Peak Flow --      Pain Score 03/23/18 1121 6     Pain Loc --      Pain Edu? --  Excl. in [place]? --     Constitutional: Alert and oriented. Well appearing and in no acute distress. Eyes: Conjunctivae are normal.  Head: Atraumatic. Nose: No congestion/rhinnorhea. Mouth/Throat: Mucous membranes are moist.   Neck:  supple no lymphadenopathy noted Cardiovascular: Normal rate, regular rhythm.  Respiratory: Normal respiratory effort.  No retractions GU: deferred Musculoskeletal: FROM all extremities, warm and well perfused Neurologic:  Normal speech and language.  Skin:  Skin is warm, dry and intact.  Small abscesses noted on the back of the legs and in the groin area.  Most have scabs noted on them but are still red and tender. Psychiatric: Mood and affect are normal. Speech and behavior are normal.  ____________________________________________   LABS (all labs ordered are listed, but only  abnormal results are displayed)  Labs Reviewed - No data to display ____________________________________________   ____________________________________________  RADIOLOGY    ____________________________________________   PROCEDURES  Procedure(s) performed: No  Procedures    ____________________________________________   INITIAL IMPRESSION / ASSESSMENT AND PLAN / ED COURSE  Pertinent labs & imaging results that were available during my care of the patient were reviewed by me and considered in my medical decision making (see chart for details).   Patient is 59 year old male presents emergency department complaining of a rash/abscess on the back of the leg and in the groin area.  Physical exam shows 2 dried up abscesses that are still red tender and swollen.  Explained the findings to the patient.  He was given a prescription for Septra DS 1 twice daily for for 7 days.  He is also to soak in a tub of water with 1/4 cup of bleach.  Explained to him this would help kill the staff on his skin which is causing the abscesses.  He states he understands will comply with our instructions.  Is discharged stable condition.     As part of my medical decision making, I reviewed the following data within the [place] notes reviewed and incorporated, Old chart reviewed, Notes from prior ED visits and  Controlled Substance Database  ____________________________________________   FINAL CLINICAL IMPRESSION(S) / ED DIAGNOSES  Final diagnoses:  Abscess      NEW MEDICATIONS STARTED DURING THIS VISIT:  New Prescriptions   SULFAMETHOXAZOLE-TRIMETHOPRIM (BACTRIM DS,SEPTRA DS) 800-160 MG TABLET    Take 1 tablet by mouth 2 (two) times daily.     Note:  This document was prepared using Dragon voice recognition software and may include unintentional dictation errors.    Versie Starks, PA-C 03/23/18 1140    Schuyler Amor, MD 03/24/18 [phone]

## 2018-03-23 NOTE — ED Notes (Signed)
Pt verbalized understanding of d/c instructions, RX, and f/u care. No further questions at this time. Pt ambulatory to the exit with steady gait  

## 2018-03-23 NOTE — ED Triage Notes (Signed)
Pt to ed with c/o rash x 1 week on torso and upper thighs and groin area.

## 2018-03-23 NOTE — Discharge Instructions (Addendum)
Take medication as prescribed.  Also if you could soak in a tub full of water with 1/4 cup bleach it will help kill the staff on your skin.  Return to the emergency department if the areas are worsening.

## 2018-03-29 ENCOUNTER — Other Ambulatory Visit: Payer: Self-pay | Admitting: Family Medicine

## 2018-03-29 NOTE — Telephone Encounter (Signed)
Called pt no answer, will try again.

## 2018-04-01 NOTE — Telephone Encounter (Signed)
Spoke with pt and he stated that his skin infection is getting better but it is not completely healed and would like to know if and how he could be seen for a refill.

## 2018-12-18 ENCOUNTER — Encounter: Payer: Self-pay | Admitting: Nurse Practitioner

## 2018-12-18 ENCOUNTER — Other Ambulatory Visit: Payer: Self-pay

## 2018-12-18 ENCOUNTER — Ambulatory Visit (INDEPENDENT_AMBULATORY_CARE_PROVIDER_SITE_OTHER): Payer: 59 | Admitting: Nurse Practitioner

## 2018-12-18 DIAGNOSIS — J3489 Other specified disorders of nose and nasal sinuses: Secondary | ICD-10-CM | POA: Insufficient documentation

## 2018-12-18 NOTE — Progress Notes (Signed)
There were no vitals taken for this visit.   Subjective:    Patient ID: Steven Rivera, male    DOB: 11-23-1960, 57 y.o.   MRN: [id number]  HPI: Steven Rivera is a 58 y.o. male  Chief Complaint  Patient presents with  . URI    pt states he has had a runny nose, sneezing and slight fever since yesterday morning. States his employer wants him to be tested    . This visit was completed via FaceTime due to the restrictions of the COVID-19 pandemic. All issues as above were discussed and addressed. Physical exam was done as above through visual confirmation on FaceTime. If it was felt that the patient should be evaluated in the office, they were directed there. The patient verbally consented to this visit. . Location of the patient: home . Location of the provider: work . Those involved with this call:  . Provider: Marnee Guarneri, DNP . CMA: Yvonna Alanis, CMA . Front Desk/Registration: Jill Side  . Time spent on call: 15 minutes with patient face to face via video conference. More than 50% of this time was spent in counseling and coordination of care. 10 minutes total spent in review of patient's record and preparation of their chart.  . I verified patient identity using two factors (patient name and date of birth). Patient consents verbally to being seen via telemedicine visit today.    UPPER RESPIRATORY TRACT INFECTION Took yesterday because he was not feeling well and nurse told him he needs Covid test.  Works at [employer].  Started feeling "a little rough" yesterday morning when he woke up.  Has not been around anyone he knows of with Covid.  Denies loss of taste or smell.  His work is requesting he obtain testing. Fever: yes Cough: no Shortness of breath: no Wheezing: no Chest pain: no Chest tightness: no Chest congestion: no Nasal congestion: yes Runny nose: yes Post nasal drip: yes Sneezing: yes Sore throat: no Swollen glands: no Sinus pressure:  no Headache: no Face pain: no Toothache: no Ear pain: none Ear pressure: none Eyes red/itching:yes Eye drainage/crusting: no  Vomiting: no Rash: no Fatigue: yes Sick contacts: no Strep contacts: no  Context: stable Recurrent sinusitis: no Relief with OTC cold/cough medications: none taken  Treatments attempted: none and mucinex   Relevant past medical, surgical, family and social history reviewed and updated as indicated. Interim medical history since our last visit reviewed. Allergies and medications reviewed and updated.  Review of Systems  Constitutional: Positive for fever. Negative for activity change, diaphoresis and fatigue.  HENT: Positive for congestion, postnasal drip, rhinorrhea and sneezing. Negative for sinus pressure, sinus pain and sore throat.   Respiratory: Negative for cough, chest tightness, shortness of breath and wheezing.   Cardiovascular: Negative for chest pain, palpitations and leg swelling.  Gastrointestinal: Negative for abdominal distention, abdominal pain, constipation, diarrhea, nausea and vomiting.  Neurological: Negative for headaches.  Psychiatric/Behavioral: Negative.     Per HPI unless specifically indicated above     Objective:    There were no vitals taken for this visit.  Wt Readings from Last 3 Encounters:  03/23/18 154 lb 15.7 oz (70.3 kg)  05/25/17 149 lb (67.6 kg)  05/17/17 148 lb 2 oz (67.2 kg)    Physical Exam Vitals and nursing note reviewed.  Constitutional:      General: He is awake. He is not in acute distress.    Appearance: He is well-developed. He is not  ill-appearing.  HENT:     Head: Normocephalic.     Right Ear: Hearing normal. No drainage.     Left Ear: Hearing normal. No drainage.  Eyes:     General: Lids are normal.        Right eye: No discharge.        Left eye: No discharge.     Conjunctiva/sclera: Conjunctivae normal.  Pulmonary:     Effort: Pulmonary effort is normal. No accessory muscle usage or  respiratory distress.  Musculoskeletal:     Cervical back: Normal range of motion.  Neurological:     Mental Status: He is alert and oriented to person, place, and time.  Psychiatric:        Mood and Affect: Mood normal.        Behavior: Behavior normal. Behavior is cooperative.        Thought Content: Thought content normal.        Judgment: Judgment normal.     Results for orders placed or performed in visit on 05/25/17  Lipid Profile  Result Value Ref Range   Cholesterol 207 (H) <200 mg/dL   HDL 43 >40 mg/dL   Triglycerides 127 <150 mg/dL   LDL Cholesterol (Calc) 139 (H) mg/dL (calc)   Total CHOL/HDL Ratio 4.8 <5.0 (calc)   Non-HDL Cholesterol (Calc) 164 (H) <130 mg/dL (calc)      Assessment & Plan:   Problem List Items Addressed This Visit      Other   Rhinorrhea - Primary    Due to current Covid pandemic and symptoms being present will send for testing.  Order placed. Have recommended maintaining a home quarantine at this time until results return, if negative and symptoms improve then can return to work, but if positive needs to maintain quarantine for 10 days.  Patient agrees with this POC.  Continue simple treatment at home and follow-up for worsening symptoms.      Relevant Orders   Novel Coronavirus, NAA ([facility])      I discussed the assessment and treatment plan with the patient. The patient was provided an opportunity to ask questions and all were answered. The patient agreed with the plan and demonstrated an understanding of the instructions.   The patient was advised to call back or seek an in-person evaluation if the symptoms worsen or if the condition fails to improve as anticipated.   I provided 15 minutes of time during this encounter.  Follow up plan: Return if symptoms worsen or fail to improve.

## 2018-12-18 NOTE — Patient Instructions (Signed)

## 2018-12-18 NOTE — Assessment & Plan Note (Signed)
Due to current Covid pandemic and symptoms being present will send for testing.  Order placed. Have recommended maintaining a home quarantine at this time until results return, if negative and symptoms improve then can return to work, but if positive needs to maintain quarantine for 10 days.  Patient agrees with this POC.  Continue simple treatment at home and follow-up for worsening symptoms.

## 2018-12-19 ENCOUNTER — Other Ambulatory Visit: Payer: Self-pay

## 2018-12-19 ENCOUNTER — Ambulatory Visit: Payer: HRSA Program | Attending: Internal Medicine

## 2018-12-19 DIAGNOSIS — Z20822 Contact with and (suspected) exposure to covid-19: Secondary | ICD-10-CM

## 2018-12-19 DIAGNOSIS — Z20828 Contact with and (suspected) exposure to other viral communicable diseases: Secondary | ICD-10-CM | POA: Insufficient documentation

## 2018-12-20 LAB — NOVEL CORONAVIRUS, NAA: SARS-CoV-2, NAA: NOT DETECTED

## 2018-12-20 NOTE — Progress Notes (Signed)
Orders moved to this encounter.

## 2018-12-20 NOTE — Progress Notes (Signed)
Order(s) created erroneously. Erroneous order ID: [id number]  Order moved by: Brigitte Pulse  Order move date/time: 12/20/2018 2:26 PM  Source Patient: [id number]  Source Contact: 12/19/2018  Destination Patient: [id number]  Destination Contact: 12/19/2018

## 2018-12-20 NOTE — Progress Notes (Signed)
Order(s) created erroneously. Erroneous order ID: [id number]  Order moved by: Brigitte Pulse  Order move date/time: 12/20/2018 2:26 PM  Source Patient: [id number]  Source Contact: 12/19/2018  Destination Patient: [id number]  Destination Contact: 12/19/2018

## 2018-12-21 ENCOUNTER — Telehealth: Payer: Self-pay | Admitting: General Practice

## 2018-12-21 NOTE — Telephone Encounter (Signed)
Negative COVID results given. Patient results "NOT Detected." Caller expressed understanding. ° °

## 2018-12-23 ENCOUNTER — Telehealth: Payer: Self-pay | Admitting: Family Medicine

## 2018-12-23 NOTE — Telephone Encounter (Signed)
Called pt he states he will be in today to pick it up.

## 2018-12-23 NOTE — Telephone Encounter (Signed)
Copy up front for him  Copied from [facility] [phone]. Topic: General - Other >> Dec 22, 2018  8:57 AM Rainey Pines A wrote: Patient would like to pick up a copy of covid test results in office.Patient stated that he has tried to get results printer off my chart multiple times and it is not working for him. Please advise

## 2019-03-22 IMAGING — MR MR HEAD W/O CM
10 series · 48 of 48 positions shown · non-contrast
Comparison: CT 05/17/2017

CLINICAL DATA: TIA 3 weeks ago with slurred speech.

EXAM:
MRI HEAD WITHOUT CONTRAST
TECHNIQUE: Multiplanar, multiecho pulse sequences of the brain and surrounding
structures were obtained without intravenous contrast.

[Series 2: T1 · sagittal · 5.0mm · 0.45mm/px · 1 of 23 slices shown (1 of 2)]
[im 1/23]
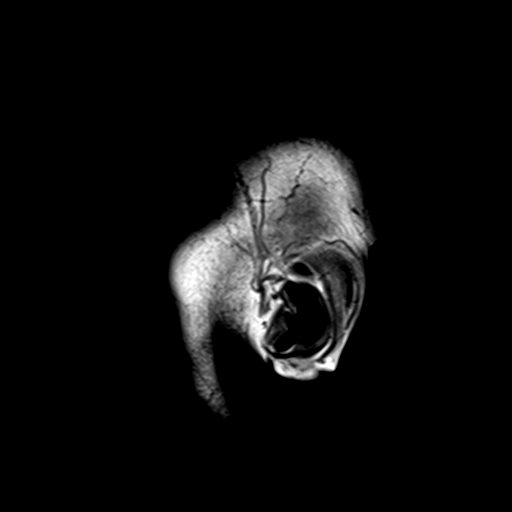

[Series 4: DWI · axial · 3.0mm · 1.20mm/px · z∈[-42,+123]mm · 5 of 56 slices shown (1 of 4)]
[im 1/56]
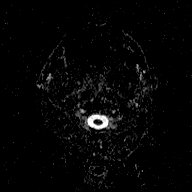
[im 14/56]
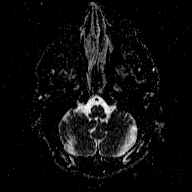
[im 28/56]
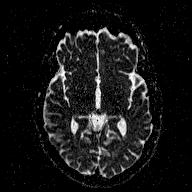
[im 42/56]
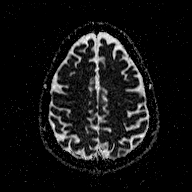
[im 56/56]
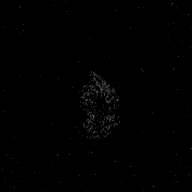

[Series 6: DWI · coronal · 3.0mm · 1.15mm/px · 5 of 50 slices shown (2 of 4)]
[im 1/50]
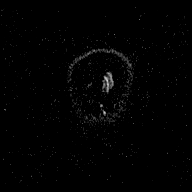
[im 13/50]
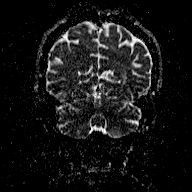
[im 25/50]
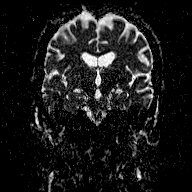
[im 37/50]
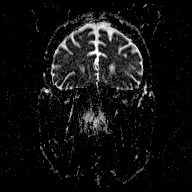
[im 50/50]
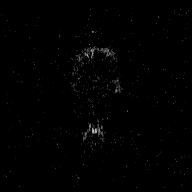

[Series 7: T2 · axial · 5.0mm · 0.72mm/px · z∈[-40,+121]mm · 2 of 24 slices shown (1 of 3)]
[im 1/24]
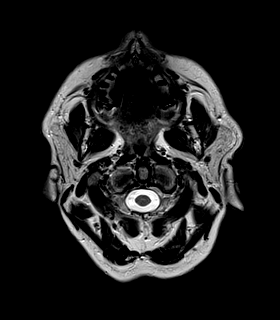
[im 24/24]
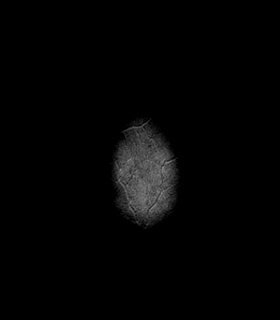

[Series 8: FLAIR · axial · 3.0mm · 0.45mm/px · z∈[-41,+121]mm · 5 of 55 slices shown]
[im 1/55]
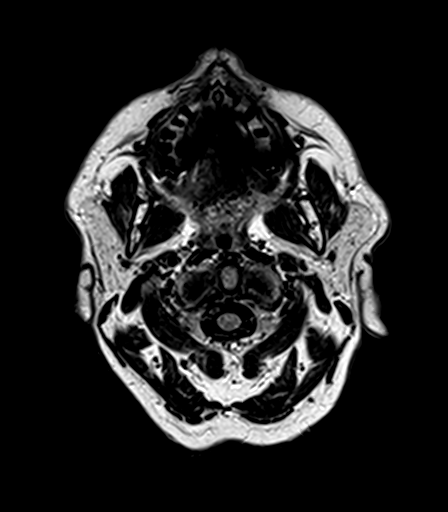
[im 14/55]
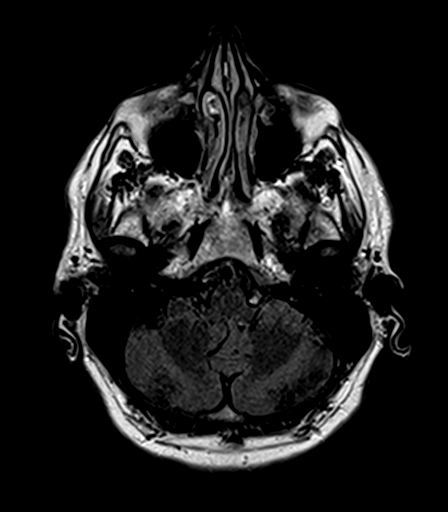
[im 28/55]
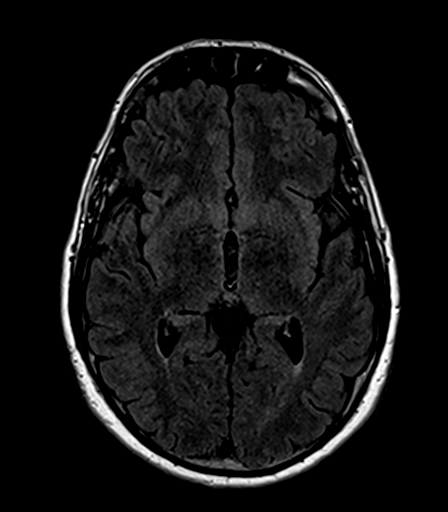
[im 41/55]
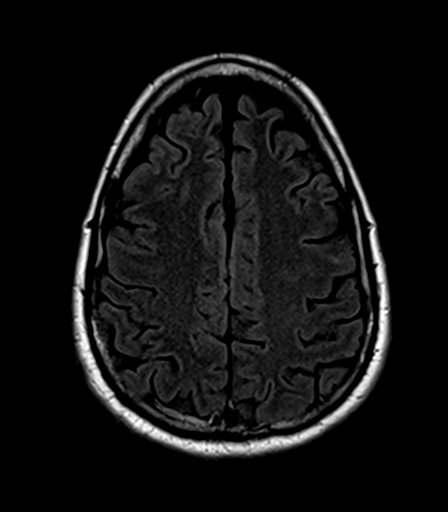
[im 55/55]
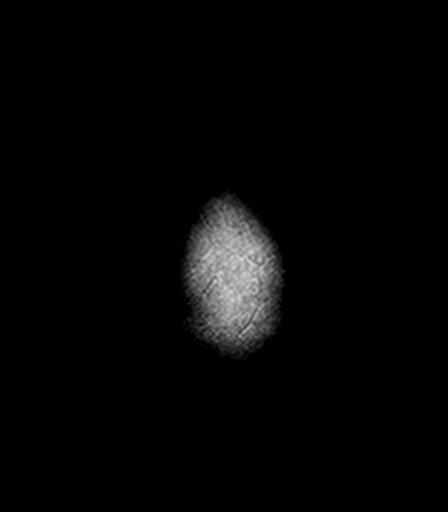

[Series 9: T2 · axial · 5.0mm · 0.72mm/px · z∈[-40,+121]mm · 2 of 24 slices shown (2 of 3)]
[im 1/24]
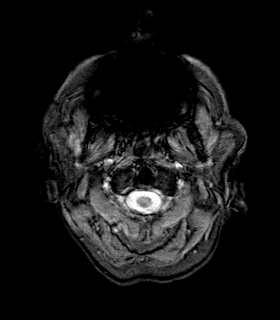
[im 24/24]
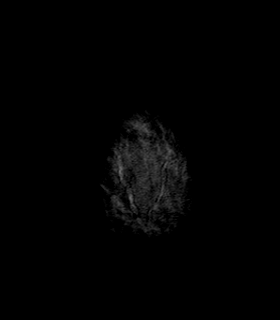

[Series 10: T1 · axial · 1.0mm · 1.00mm/px · z∈[-39,+120]mm · 15 of 160 slices shown (2 of 2)]
[im 1/160]
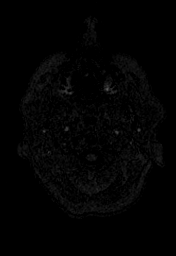
[im 12/160]
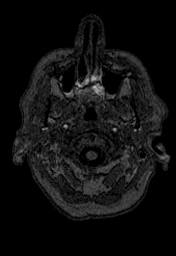
[im 23/160]
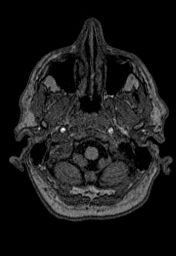
[im 35/160]
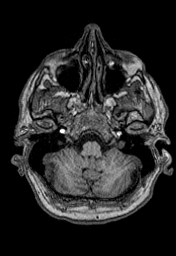
[im 46/160]
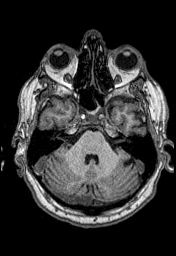
[im 57/160]
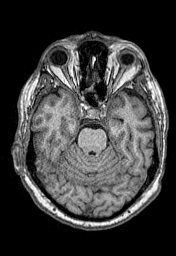
[im 69/160]
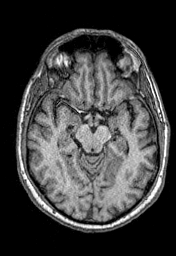
[im 80/160]
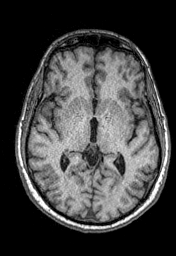
[im 91/160]
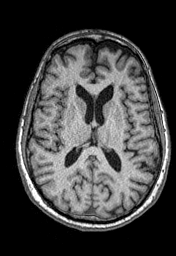
[im 103/160]
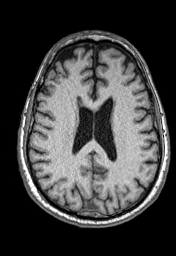
[im 114/160]
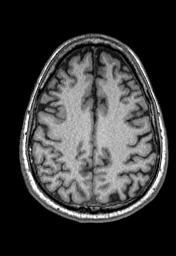
[im 125/160]
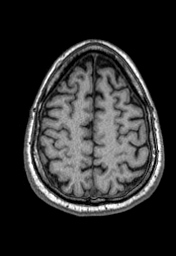
[im 137/160]
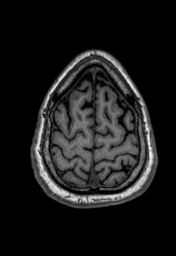
[im 148/160]
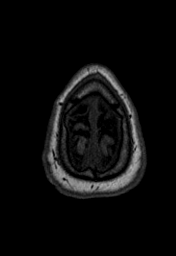
[im 160/160]
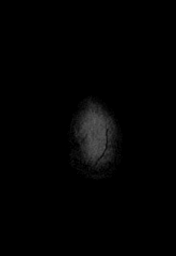

[Series 11: T2 · coronal · 5.0mm · 0.43mm/px · 3 of 30 slices shown (3 of 3)]
[im 1/30]
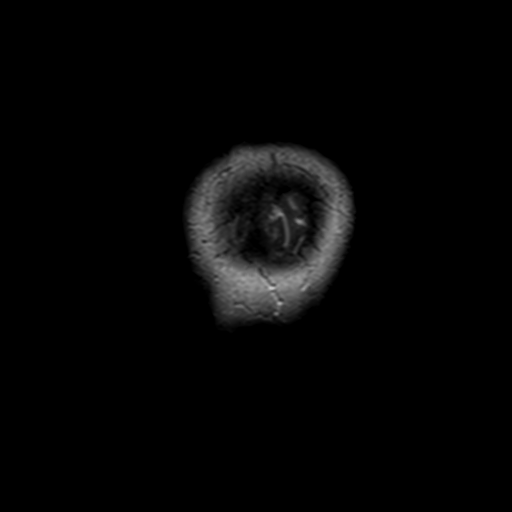
[im 15/30]
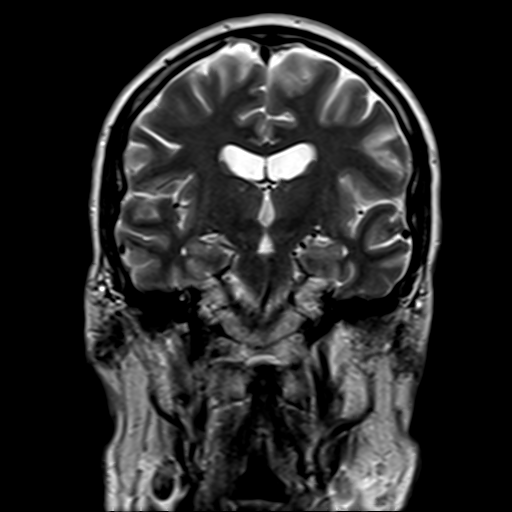
[im 30/30]
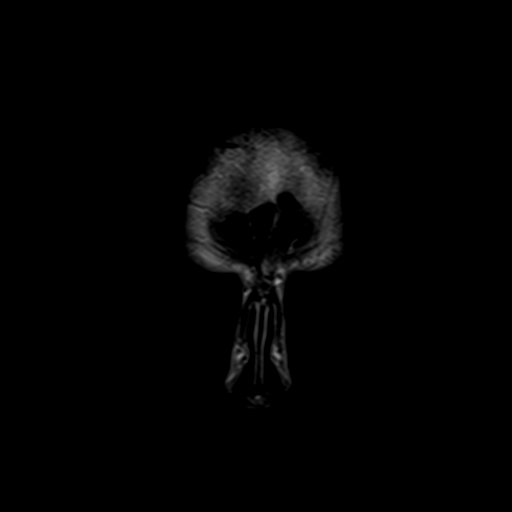

[Series 100: DWI · axial · 3.0mm · 1.20mm/px · z∈[-42,+123]mm · 5 of 56 slices shown (3 of 4)]
[im 1/56]
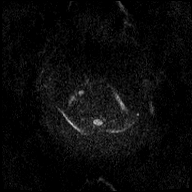
[im 14/56]
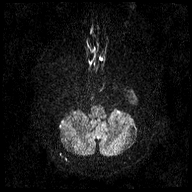
[im 28/56]
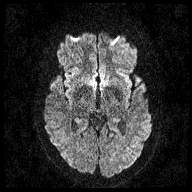
[im 42/56]
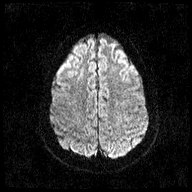
[im 56/56]
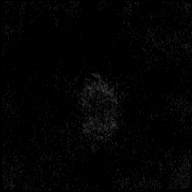

[Series 101: DWI · coronal · 3.0mm · 1.15mm/px · 5 of 50 slices shown (4 of 4)]
[im 1/50]
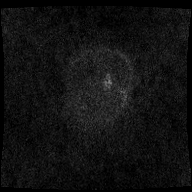
[im 13/50]
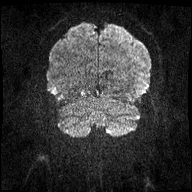
[im 25/50]
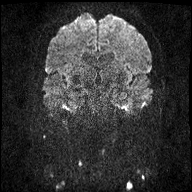
[im 37/50]
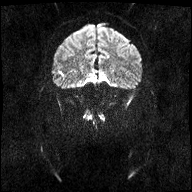
[im 50/50]
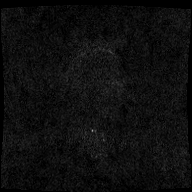

[48 of 48 positions shown; findings below may reference images not displayed]

FINDINGS: Brain: Diffusion imaging does not show any acute or subacute
infarction. The brainstem and cerebellum are normal. Cerebral
hemispheres are normal. No evidence of old small or large vessel
insult. No mass lesion, hemorrhage, hydrocephalus or extra-axial
collection.

Vascular: Major vessels at the base of the brain show flow.

Skull and upper cervical spine: Negative

Sinuses/Orbits: Clear/normal

Other: None
IMPRESSION: Normal MRI of the brain.
